# Patient Record
Sex: Female | Born: 1985
Health system: Southern US, Community
[De-identification: ages and names within clinical notes are randomized; demographics above are authoritative.]

## PROBLEM LIST (undated history)

## (undated) DIAGNOSIS — R569 Unspecified convulsions: Secondary | ICD-10-CM

## (undated) DIAGNOSIS — B182 Chronic viral hepatitis C: Secondary | ICD-10-CM

## (undated) DIAGNOSIS — E119 Type 2 diabetes mellitus without complications: Secondary | ICD-10-CM

## (undated) HISTORY — DX: Chronic viral hepatitis C: B18.2

---

## 2004-02-13 ENCOUNTER — Emergency Department (HOSPITAL_COMMUNITY): Admission: EM | Admit: 2004-02-13 | Discharge: 2004-02-13 | Payer: Self-pay | Admitting: Emergency Medicine

## 2014-10-10 ENCOUNTER — Emergency Department (HOSPITAL_COMMUNITY)
Admission: EM | Admit: 2014-10-10 | Discharge: 2014-10-12 | Disposition: A | Discharge status: Home / Self Care | Payer: Medicaid Other | Attending: Emergency Medicine | Admitting: Emergency Medicine

## 2014-10-10 ENCOUNTER — Encounter (HOSPITAL_COMMUNITY): Payer: Self-pay | Admitting: Emergency Medicine

## 2014-10-10 DIAGNOSIS — Z79899 Other long term (current) drug therapy: Secondary | ICD-10-CM | POA: Diagnosis not present

## 2014-10-10 DIAGNOSIS — F1120 Opioid dependence, uncomplicated: Secondary | ICD-10-CM | POA: Diagnosis not present

## 2014-10-10 DIAGNOSIS — F1110 Opioid abuse, uncomplicated: Secondary | ICD-10-CM | POA: Insufficient documentation

## 2014-10-10 DIAGNOSIS — Z3A33 33 weeks gestation of pregnancy: Secondary | ICD-10-CM | POA: Diagnosis not present

## 2014-10-10 DIAGNOSIS — G40909 Epilepsy, unspecified, not intractable, without status epilepticus: Secondary | ICD-10-CM | POA: Diagnosis not present

## 2014-10-10 DIAGNOSIS — Z91040 Latex allergy status: Secondary | ICD-10-CM | POA: Insufficient documentation

## 2014-10-10 DIAGNOSIS — O99343 Other mental disorders complicating pregnancy, third trimester: Secondary | ICD-10-CM | POA: Diagnosis not present

## 2014-10-10 DIAGNOSIS — O99323 Drug use complicating pregnancy, third trimester: Secondary | ICD-10-CM | POA: Insufficient documentation

## 2014-10-10 DIAGNOSIS — O99353 Diseases of the nervous system complicating pregnancy, third trimester: Secondary | ICD-10-CM | POA: Diagnosis not present

## 2014-10-10 DIAGNOSIS — Z794 Long term (current) use of insulin: Secondary | ICD-10-CM | POA: Insufficient documentation

## 2014-10-10 DIAGNOSIS — O24113 Pre-existing diabetes mellitus, type 2, in pregnancy, third trimester: Secondary | ICD-10-CM | POA: Insufficient documentation

## 2014-10-10 DIAGNOSIS — F329 Major depressive disorder, single episode, unspecified: Secondary | ICD-10-CM | POA: Insufficient documentation

## 2014-10-10 HISTORY — DX: Unspecified convulsions: R56.9

## 2014-10-10 HISTORY — DX: Type 2 diabetes mellitus without complications: E11.9

## 2014-10-10 LAB — URINALYSIS, ROUTINE W REFLEX MICROSCOPIC
Glucose, UA: NEGATIVE mg/dL
Hgb urine dipstick: NEGATIVE
Ketones, ur: NEGATIVE mg/dL
NITRITE: NEGATIVE
PH: 6 (ref 5.0–8.0)
Protein, ur: NEGATIVE mg/dL
SPECIFIC GRAVITY, URINE: 1.034 — AB (ref 1.005–1.030)
Urobilinogen, UA: 1 mg/dL (ref 0.0–1.0)

## 2014-10-10 LAB — CBC WITH DIFFERENTIAL/PLATELET
BASOS ABS: 0 10*3/uL (ref 0.0–0.1)
Basophils Relative: 0 % (ref 0–1)
EOS PCT: 1 % (ref 0–5)
Eosinophils Absolute: 0 10*3/uL (ref 0.0–0.7)
HCT: 28.1 % — ABNORMAL LOW (ref 36.0–46.0)
Hemoglobin: 9.2 g/dL — ABNORMAL LOW (ref 12.0–15.0)
LYMPHS PCT: 26 % (ref 12–46)
Lymphs Abs: 1.6 10*3/uL (ref 0.7–4.0)
MCH: 29 pg (ref 26.0–34.0)
MCHC: 32.7 g/dL (ref 30.0–36.0)
MCV: 88.6 fL (ref 78.0–100.0)
Monocytes Absolute: 0.5 10*3/uL (ref 0.1–1.0)
Monocytes Relative: 8 % (ref 3–12)
NEUTROS ABS: 4 10*3/uL (ref 1.7–7.7)
Neutrophils Relative %: 65 % (ref 43–77)
PLATELETS: 280 10*3/uL (ref 150–400)
RBC: 3.17 MIL/uL — ABNORMAL LOW (ref 3.87–5.11)
RDW: 14.8 % (ref 11.5–15.5)
WBC: 6.1 10*3/uL (ref 4.0–10.5)

## 2014-10-10 LAB — SALICYLATE LEVEL

## 2014-10-10 LAB — COMPREHENSIVE METABOLIC PANEL
ALBUMIN: 2.5 g/dL — AB (ref 3.5–5.2)
ALK PHOS: 85 U/L (ref 39–117)
ALT: 20 U/L (ref 0–35)
AST: 23 U/L (ref 0–37)
Anion gap: 5 (ref 5–15)
BILIRUBIN TOTAL: 0.2 mg/dL — AB (ref 0.3–1.2)
BUN: 7 mg/dL (ref 6–23)
CHLORIDE: 109 mmol/L (ref 96–112)
CO2: 19 mmol/L (ref 19–32)
Calcium: 7.7 mg/dL — ABNORMAL LOW (ref 8.4–10.5)
Creatinine, Ser: 0.5 mg/dL (ref 0.50–1.10)
GFR calc Af Amer: >90 mL/min (ref >90)
GFR calc non Af Amer: >90 mL/min (ref >90)
Glucose, Bld: 109 mg/dL — ABNORMAL HIGH (ref 70–99)
POTASSIUM: 2.9 mmol/L — AB (ref 3.5–5.1)
Sodium: 133 mmol/L — ABNORMAL LOW (ref 135–145)
TOTAL PROTEIN: 6.3 g/dL (ref 6.0–8.3)

## 2014-10-10 LAB — RAPID URINE DRUG SCREEN, HOSP PERFORMED
AMPHETAMINES: NOT DETECTED
BARBITURATES: NOT DETECTED
BENZODIAZEPINES: NOT DETECTED
COCAINE: NOT DETECTED
Opiates: POSITIVE — AB
Tetrahydrocannabinol: NOT DETECTED

## 2014-10-10 LAB — URINE MICROSCOPIC-ADD ON

## 2014-10-10 LAB — ETHANOL

## 2014-10-10 LAB — ACETAMINOPHEN LEVEL

## 2014-10-10 MED ORDER — INSULIN ASPART 100 UNIT/ML ~~LOC~~ SOLN
0.0000 [IU] | SUBCUTANEOUS | Status: DC
Start: 2014-10-11 — End: 2014-10-11

## 2014-10-10 MED ORDER — ACETAMINOPHEN 325 MG PO TABS: 650.0000 mg | ORAL_TABLET | Freq: Four times a day (QID) | ORAL | Status: DC | PRN

## 2014-10-10 MED ORDER — POTASSIUM CHLORIDE CRYS ER 20 MEQ PO TBCR
40.0000 meq | EXTENDED_RELEASE_TABLET | Freq: Once | ORAL | Status: AC
  Administered 2014-10-11: 40 meq via ORAL
  Filled 2014-10-10: qty 2

## 2014-10-10 MED ORDER — ONDANSETRON 4 MG PO TBDP
4.0000 mg | ORAL_TABLET | Freq: Once | ORAL | Status: AC
  Administered 2014-10-10: 4 mg via ORAL
  Filled 2014-10-10: qty 1

## 2014-10-10 NOTE — ED Notes (Signed)
Rapid response nurse at bedside.

## 2014-10-10 NOTE — ED Notes (Signed)
Pt reports she is 7 months pregnant and is going into withdrawals from Roxicodone. Pt states she has hx of heroin addiction and reports she is depressed.
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
## 2014-10-10 NOTE — ED Notes (Signed)
TTS at bedside. 

## 2014-10-10 NOTE — Progress Notes (Signed)
Pt [facility] [place] (last seen earlier today for a routine office visit). She states she is a G9P0,  a Type 1 diabetic. She has a roxicodone (IV user) addiction, last used today. She states she is ready to quit and looking for help, and is depressed. She has a reactive NST (135bpm, multiple accelerations and no decelerations) and some uterine irritability.

## 2014-10-10 NOTE — Progress Notes (Signed)
Notified Dr. Jolayne Pantheronstant of patient, obstetrical history, reactive NST and no contractions. OK to d/c fetal monitoring.

## 2014-10-10 NOTE — ED Provider Notes (Signed)
CSN: [id number]     Arrival date & time 10/10/14  2019 History   First MD Initiated Contact with Patient 10/10/14 2120     Chief Complaint  Patient presents with  . Withdrawals   . Depression     (Consider location/radiation/quality/duration/timing/severity/associated sxs/prior Treatment) HPI [age] year old female with a history of diabetes and opiate drug addiction presents requesting help to detox from narcotics and heroin. The patient is currently about [redacted] weeks pregnant. 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 She [place] [facility] [place]. She's been using drugs for quite some time, recently relapsed. Shot up oxycodone today, previously used about one week ago. Over the last 3 days she's been feeling abdominal cramping, diarrhea, and aching. She attributes this to prior similar withdrawal symptoms. The patient states she has not felt any contractions although she's not know exactly what this feels like. No large gush of fluid. No dysuria, her urine has been darker though. When she gave us a urine sample she said she wiped and saw small amount of blood. Has not noticed any vaginal bleeding. No suicidal or homicidal thoughts. Does state she is depressed.  Past Medical History  Diagnosis Date  . Diabetes mellitus without complication   . Seizures    History reviewed. No pertinent past surgical history. History reviewed. No pertinent family history. History  Substance Use Topics  . Smoking status: Never Smoker   . Smokeless tobacco: Not on file  . Alcohol Use: No   OB History    Gravida Para Term Preterm AB TAB SAB Ectopic Multiple Living   1              Review of Systems  Constitutional: Positive for chills. Negative for fever.  Gastrointestinal: Positive for abdominal pain and diarrhea. Negative for nausea and vomiting.  Genitourinary: Negative for dysuria.  Musculoskeletal: Negative for back pain.  All other systems  reviewed and are negative.     Allergies  Fish allergy and Latex  Home Medications   Prior to Admission medications   Medication Sig Start Date End Date Taking? Authorizing Provider  acetaminophen (TYLENOL) 500 MG tablet Take 1,000 mg by mouth every 6 (six) hours as needed for moderate pain (pian).   Yes Historical Provider, MD  Insulin Glargine (LANTUS) 100 UNIT/ML Solostar Pen Inject into the skin 3 (three) times daily as needed (blood sugar).    Yes Historical Provider, MD  IRON PO Take 1 tablet by mouth daily.   Yes Historical Provider, MD  levETIRAcetam (KEPPRA) 250 MG tablet Take 750 mg by mouth 2 (two) times daily.   Yes Historical Provider, MD  oxycodone (ROXICODONE) 30 MG immediate release tablet Take 60 mg by mouth every 6 (six) hours as needed for pain (addiction).   Yes Historical Provider, MD  Prenatal Vit-Fe Fumarate-FA (PRENATAL MULTIVITAMIN) TABS tablet Take 1 tablet by mouth 2 (two) times daily.   Yes Historical Provider, MD  topiramate (TOPAMAX) 25 MG tablet Take 50-75 mg by mouth 3 (three) times daily. Take 3 tablets in the am, Take 2 tablets in midday and Take 3 tablets in the evening.   Yes Historical Provider, MD   BP 135/79 mmHg  Pulse 77  Temp(Src) 98 F (36.7 C) (Oral)  Resp 15  SpO2 98% Physical Exam  Constitutional: She is oriented to person, place, and time. She appears well-developed and well-nourished.  HENT:  Head: Normocephalic and atraumatic.  Right Ear: External  ear normal.  Left Ear: External ear normal.  Nose: Nose normal.  Eyes: Right eye exhibits no discharge. Left eye exhibits no discharge.  Cardiovascular: Normal rate, regular rhythm and normal heart sounds.   Pulmonary/Chest: Effort normal and breath sounds normal.  Abdominal: Soft. There is no tenderness.  Neurological: She is alert and oriented to person, place, and time.  Skin: Skin is warm and dry.  Vitals reviewed.   ED Course  Procedures (including critical care time) Labs  Review Labs Reviewed  COMPREHENSIVE METABOLIC PANEL - Abnormal; Notable for the following:    Sodium 133 (*)    Potassium 2.9 (*)    Glucose, Bld 109 (*)    Calcium 7.7 (*)    Albumin 2.5 (*)    Total Bilirubin 0.2 (*)    All other components within normal limits  URINALYSIS, ROUTINE W REFLEX MICROSCOPIC - Abnormal; Notable for the following:    Color, Urine AMBER (*)    APPearance CLOUDY (*)    Specific Gravity, Urine 1.034 (*)    Bilirubin Urine SMALL (*)    Leukocytes, UA TRACE (*)    All other components within normal limits  URINE RAPID DRUG SCREEN (HOSP PERFORMED) - Abnormal; Notable for the following:    Opiates POSITIVE (*)    All other components within normal limits  CBC WITH DIFFERENTIAL/PLATELET - Abnormal; Notable for the following:    RBC 3.17 (*)    Hemoglobin 9.2 (*)    HCT 28.1 (*)    All other components within normal limits  ACETAMINOPHEN LEVEL - Abnormal; Notable for the following:    Acetaminophen (Tylenol), Serum <10.0 (*)    All other components within normal limits  URINE CULTURE  SALICYLATE LEVEL  ETHANOL  URINE MICROSCOPIC-ADD ON  BASIC METABOLIC PANEL  CBG MONITORING, ED    Imaging Review No results found.   EKG Interpretation None      MDM   Final diagnoses:  Opiate abuse, episodic   Patient evaluated by rapid response nurse. No further blood noted, patient wore pad with no blood present. OB at this time does not recommend manual exam. No contractions on monitor, mild irritability but baby appears well. Psych consulted given she is a complex patient requesting detox, they will begin to search for placement and hold overnight.      Pricilla Loveless, MD 10/11/14 [phone]

## 2014-10-11 DIAGNOSIS — F1110 Opioid abuse, uncomplicated: Secondary | ICD-10-CM | POA: Insufficient documentation

## 2014-10-11 DIAGNOSIS — F1120 Opioid dependence, uncomplicated: Secondary | ICD-10-CM | POA: Diagnosis not present

## 2014-10-11 LAB — BASIC METABOLIC PANEL
ANION GAP: 5 (ref 5–15)
BUN: 7 mg/dL (ref 6–23)
CHLORIDE: 111 mmol/L (ref 96–112)
CO2: 19 mmol/L (ref 19–32)
Calcium: 7.8 mg/dL — ABNORMAL LOW (ref 8.4–10.5)
Creatinine, Ser: 0.53 mg/dL (ref 0.50–1.10)
GFR calc non Af Amer: >90 mL/min (ref >90)
Glucose, Bld: 82 mg/dL (ref 70–99)
POTASSIUM: 3.5 mmol/L (ref 3.5–5.1)
SODIUM: 135 mmol/L (ref 135–145)

## 2014-10-11 LAB — CBG MONITORING, ED
GLUCOSE-CAPILLARY: 80 mg/dL (ref 70–99)
GLUCOSE-CAPILLARY: 91 mg/dL (ref 70–99)
Glucose-Capillary: 78 mg/dL (ref 70–99)
Glucose-Capillary: 89 mg/dL (ref 70–99)
Glucose-Capillary: 86 mg/dL (ref 70–99)

## 2014-10-11 MED ORDER — INSULIN ASPART 100 UNIT/ML ~~LOC~~ SOLN: 0.0000 [IU] | Freq: Three times a day (TID) | SUBCUTANEOUS | Status: DC

## 2014-10-11 MED ORDER — ONDANSETRON HCL 4 MG PO TABS
4.0000 mg | ORAL_TABLET | Freq: Three times a day (TID) | ORAL | Status: DC | PRN
  Administered 2014-10-11 – 2014-10-12 (×2): 4 mg via ORAL
  Filled 2014-10-11 (×2): qty 1

## 2014-10-11 MED ORDER — HYDROXYZINE HCL 25 MG PO TABS
25.0000 mg | ORAL_TABLET | Freq: Three times a day (TID) | ORAL | Status: DC | PRN
  Administered 2014-10-11: 25 mg via ORAL
  Filled 2014-10-11: qty 1

## 2014-10-11 MED ORDER — PROMETHAZINE HCL 25 MG PO TABS
25.0000 mg | ORAL_TABLET | Freq: Once | ORAL | Status: AC
  Administered 2014-10-11: 25 mg via ORAL
  Filled 2014-10-11: qty 1

## 2014-10-11 MED ORDER — FOLIC ACID 1 MG PO TABS
1.0000 mg | ORAL_TABLET | Freq: Every day | ORAL | Status: DC
  Administered 2014-10-12: 1 mg via ORAL
  Filled 2014-10-11: qty 1

## 2014-10-11 MED ORDER — ADULT MULTIVITAMIN W/MINERALS CH
1.0000 | ORAL_TABLET | Freq: Every day | ORAL | Status: DC
  Administered 2014-10-12: 1 via ORAL
  Filled 2014-10-11: qty 1

## 2014-10-11 MED ORDER — CLONIDINE HCL 0.1 MG PO TABS
0.1000 mg | ORAL_TABLET | Freq: Three times a day (TID) | ORAL | Status: DC
  Administered 2014-10-11 – 2014-10-12 (×3): 0.1 mg via ORAL
  Filled 2014-10-11 (×3): qty 1

## 2014-10-11 MED ORDER — LEVETIRACETAM 750 MG PO TABS
750.0000 mg | ORAL_TABLET | Freq: Two times a day (BID) | ORAL | Status: DC
  Administered 2014-10-11 – 2014-10-12 (×3): 750 mg via ORAL
  Filled 2014-10-11 (×5): qty 1

## 2014-10-11 NOTE — ED Provider Notes (Signed)
I have evaluated the patient's chart. I have looked up her medications. I spoke with our OB/GYN on-call for recommendations regarding medication treatment for her #1 narcotic withdrawal, and #2 her seizure disorder.  Topamax is class D and should be avoided. However, it is most teratogenic during neural tube formation in first trimester. I'm hesitant to order this being class D in a pregnant female.  Keppra is class C and will be continued for her seizure prophylaxis.  Regarding her narcotic withdrawal: This is not a risk for her for seizures or for medical instability. Regarding her symptoms, OB/GYN felt that hydroxyzine, and clonidine were both safe and somewhat effective in pregnant females in third trimester for treatment of alcohol trauma.  Orders written for Keppra, hydroxyzine, and clonidine.   Rolland PorterMark Jettie Lazare, MD 10/11/14 1350

## 2014-10-11 NOTE — Consult Note (Signed)
[facility] Consult   Reason for Consult:  Pregnant , Opioid use disorder, severe Referring Physician:  EDP Patient Identification: Elizabeth Clarke MRN:  [id number] Principal Diagnosis: Opioid use disorder, severe, dependence Diagnosis:   Patient Active Problem List   Diagnosis Date Noted  . Opioid use disorder, severe, dependence [F11.20] 10/11/2014    Total Time spent with patient: 1 hour  Subjective:   Elizabeth Clarke is a 29 y.o. female patient admitted with Opioid use disorder, pregnant.  HPI:  Caucasian female, 8 months pregnant came in seeking detox treatment from [facility].  Patient is also 8 months pregnant.  Patient reported using Heroin and Roxicodone.  She reported that she has been using these two drugs since 3 and half months into her pregnancy.   She also stated that she has had addiction issues long before her pregnancy.  She uses 2-3 GM of Heroin a day and she uses daily, last use was yesterday.  She uses 2 Roxicodone daily.  She reports Withdrawal symptoms of Diarrhea, vomiting and shakes.   Patient denies any other hx of mental illness and does not see a Psychiatrist.  We are looking for placement at a facility capable of managing pregnant women with addiction.  Patient is being referred to [facility] and we will contact [facility] for treatment.  We are treating her nausea and vomiting with Zofran.  HPI Elements:   Location:  Opioid Dependence, severe. Quality:  severe. Severity:  severe. Timing:  Acute. Duration:  a Year. Context:  Seeking Detox treatment..  Past Medical History:  Past Medical History  Diagnosis Date  . Diabetes mellitus without complication   . Seizures    History reviewed. No pertinent past surgical history. Family History: History reviewed. No pertinent family history. Social History:  History  Alcohol Use No     History  Drug Use  . Yes  . Special: Heroin, Other-see comments    Comment: injects "roxys"    History    Social History  . Marital Status: Single    Spouse Name: N/A  . Number of Children: N/A  . Years of Education: N/A   Social History Main Topics  . Smoking status: Never Smoker   . Smokeless tobacco: Not on file  . Alcohol Use: No  . Drug Use: Yes    Special: Heroin, Other-see comments     Comment: injects "roxys"  . Sexual Activity: Yes   Other Topics Concern  . None   Social History Narrative  . None   Additional Social History:    History of alcohol / drug use?: Yes Negative Consequences of Use: Personal relationships, Legal Name of Substance 1: Opioids  1 - Age of First Use: 26 1 - Amount (size/oz): "3-77m" 1 - Frequency: daily  1 - Duration: 1 mo 1 - Last Use / Amount: 10-10-14 "I shot some up today".                    Allergies:   Allergies  Allergen Reactions  . Fish Allergy Anaphylaxis  . Latex Rash    Labs:  Results for orders placed or performed during the hospital encounter of 10/10/14 (from the past 48 hour(s))  Urinalysis, Routine w reflex microscopic     Status: Abnormal   Collection Time: 10/10/14  9:58 PM  Result Value Ref Range   Color, Urine AMBER (A) YELLOW    Comment: BIOCHEMICALS MAY BE AFFECTED BY COLOR   APPearance CLOUDY (A)  CLEAR   Specific Gravity, Urine 1.034 (H) 1.005 - 1.030   pH 6.0 5.0 - 8.0   Glucose, UA NEGATIVE NEGATIVE mg/dL   Hgb urine dipstick NEGATIVE NEGATIVE   Bilirubin Urine SMALL (A) NEGATIVE   Ketones, ur NEGATIVE NEGATIVE mg/dL   Protein, ur NEGATIVE NEGATIVE mg/dL   Urobilinogen, UA 1.0 0.0 - 1.0 mg/dL   Nitrite NEGATIVE NEGATIVE   Leukocytes, UA TRACE (A) NEGATIVE  Urine rapid drug screen (hosp performed)     Status: Abnormal   Collection Time: 10/10/14  9:58 PM  Result Value Ref Range   Opiates POSITIVE (A) NONE DETECTED   Cocaine NONE DETECTED NONE DETECTED   Benzodiazepines NONE DETECTED NONE DETECTED   Amphetamines NONE DETECTED NONE DETECTED   Tetrahydrocannabinol NONE DETECTED NONE DETECTED    Barbiturates NONE DETECTED NONE DETECTED    Comment:        DRUG SCREEN FOR MEDICAL PURPOSES ONLY.  IF CONFIRMATION IS NEEDED FOR ANY PURPOSE, NOTIFY LAB WITHIN 5 DAYS.        LOWEST DETECTABLE LIMITS FOR URINE DRUG SCREEN Drug Class       Cutoff (ng/mL) Amphetamine      1000 Barbiturate      200 Benzodiazepine   433 Tricyclics       295 Opiates          300 Cocaine          300 THC              50   Urine microscopic-add on     Status: None   Collection Time: 10/10/14  9:58 PM  Result Value Ref Range   Squamous Epithelial / LPF RARE RARE   WBC, UA 0-2 <3 WBC/hpf  Comprehensive metabolic panel     Status: Abnormal   Collection Time: 10/10/14 10:16 PM  Result Value Ref Range   Sodium 133 (L) 135 - 145 mmol/L   Potassium 2.9 (L) 3.5 - 5.1 mmol/L   Chloride 109 96 - 112 mmol/L   CO2 19 19 - 32 mmol/L   Glucose, Bld 109 (H) 70 - 99 mg/dL   BUN 7 6 - 23 mg/dL   Creatinine, Ser 0.50 0.50 - 1.10 mg/dL   Calcium 7.7 (L) 8.4 - 10.5 mg/dL   Total Protein 6.3 6.0 - 8.3 g/dL   Albumin 2.5 (L) 3.5 - 5.2 g/dL   AST 23 0 - 37 U/L   ALT 20 0 - 35 U/L   Alkaline Phosphatase 85 39 - 117 U/L   Total Bilirubin 0.2 (L) 0.3 - 1.2 mg/dL   GFR calc non Af Amer >90 >90 mL/min   GFR calc Af Amer >90 >90 mL/min    Comment: (NOTE) The eGFR has been calculated using the CKD EPI equation. This calculation has not been validated in all clinical situations. eGFR's persistently <90 mL/min signify possible Chronic Kidney Disease.    Anion gap 5 5 - 15  CBC with Differential     Status: Abnormal   Collection Time: 10/10/14 10:16 PM  Result Value Ref Range   WBC 6.1 4.0 - 10.5 K/uL   RBC 3.17 (L) 3.87 - 5.11 MIL/uL   Hemoglobin 9.2 (L) 12.0 - 15.0 g/dL   HCT 28.1 (L) 36.0 - 46.0 %   MCV 88.6 78.0 - 100.0 fL   MCH 29.0 26.0 - 34.0 pg   MCHC 32.7 30.0 - 36.0 g/dL   RDW 14.8 11.5 - 15.5 %   Platelets 280 150 -  400 K/uL   Neutrophils Relative % 65 43 - 77 %   Neutro Abs 4.0 1.7 - 7.7 K/uL    Lymphocytes Relative 26 12 - 46 %   Lymphs Abs 1.6 0.7 - 4.0 K/uL   Monocytes Relative 8 3 - 12 %   Monocytes Absolute 0.5 0.1 - 1.0 K/uL   Eosinophils Relative 1 0 - 5 %   Eosinophils Absolute 0.0 0.0 - 0.7 K/uL   Basophils Relative 0 0 - 1 %   Basophils Absolute 0.0 0.0 - 0.1 K/uL  Salicylate level     Status: None   Collection Time: 10/10/14 10:16 PM  Result Value Ref Range   Salicylate Lvl <8.5 2.8 - 20.0 mg/dL  Acetaminophen level     Status: Abnormal   Collection Time: 10/10/14 10:16 PM  Result Value Ref Range   Acetaminophen (Tylenol), Serum <10.0 (L) 10 - 30 ug/mL    Comment:        THERAPEUTIC CONCENTRATIONS VARY SIGNIFICANTLY. A RANGE OF 10-30 ug/mL MAY BE AN EFFECTIVE CONCENTRATION FOR MANY PATIENTS. HOWEVER, SOME ARE BEST TREATED AT CONCENTRATIONS OUTSIDE THIS RANGE. ACETAMINOPHEN CONCENTRATIONS >150 ug/mL AT 4 HOURS AFTER INGESTION AND >50 ug/mL AT 12 HOURS AFTER INGESTION ARE OFTEN ASSOCIATED WITH TOXIC REACTIONS.   Ethanol     Status: None   Collection Time: 10/10/14 10:16 PM  Result Value Ref Range   Alcohol, Ethyl (B) <5 0 - 9 mg/dL    Comment:        LOWEST DETECTABLE LIMIT FOR SERUM ALCOHOL IS 11 mg/dL FOR MEDICAL PURPOSES ONLY   CBG monitoring, ED     Status: None   Collection Time: 10/11/14 12:29 AM  Result Value Ref Range   Glucose-Capillary 91 70 - 99 mg/dL  CBG monitoring, ED     Status: None   Collection Time: 10/11/14  4:06 AM  Result Value Ref Range   Glucose-Capillary 78 70 - 99 mg/dL  Basic metabolic panel     Status: Abnormal   Collection Time: 10/11/14  5:58 AM  Result Value Ref Range   Sodium 135 135 - 145 mmol/L   Potassium 3.5 3.5 - 5.1 mmol/L    Comment: DELTA CHECK NOTED REPEATED TO VERIFY NO VISIBLE HEMOLYSIS    Chloride 111 96 - 112 mmol/L   CO2 19 19 - 32 mmol/L   Glucose, Bld 82 70 - 99 mg/dL   BUN 7 6 - 23 mg/dL   Creatinine, Ser 0.53 0.50 - 1.10 mg/dL   Calcium 7.8 (L) 8.4 - 10.5 mg/dL   GFR calc non Af Amer >90  >90 mL/min   GFR calc Af Amer >90 >90 mL/min    Comment: (NOTE) The eGFR has been calculated using the CKD EPI equation. This calculation has not been validated in all clinical situations. eGFR's persistently <90 mL/min signify possible Chronic Kidney Disease.    Anion gap 5 5 - 15  CBG monitoring, ED     Status: None   Collection Time: 10/11/14 12:00 PM  Result Value Ref Range   Glucose-Capillary 89 70 - 99 mg/dL    Vitals: Blood pressure 110/70, pulse 70, temperature 98.1 F (36.7 C), temperature source Oral, resp. rate 18, last menstrual period 02/23/2014, SpO2 98 %.  Risk to Self: Suicidal Ideation: Yes-Currently Present Suicidal Intent: No Is patient at risk for suicide?: Yes Suicidal Plan?: No Access to Means: No What has been your use of drugs/alcohol within the last 12 months?: Daily opioid use reported.  How many times?: 4 Other Self Harm Risks: Cutting  Triggers for Past Attempts: Unpredictable Intentional Self Injurious Behavior: Cutting Comment - Self Injurious Behavior: Pt reported that she has a history of cutting. Most recent cut approximately 2 weeks ago.  Risk to Others: Homicidal Ideation: No Thoughts of Harm to Others: No Current Homicidal Intent: No Current Homicidal Plan: No Access to Homicidal Means: No Identified Victim: NA History of harm to others?: No Assessment of Violence: On admission Violent Behavior Description: No violent behaviors observed at this time.  Does patient have access to weapons?: No Criminal Charges Pending?: Yes Describe Pending Criminal Charges: Violation of probation, stealing and drug possession.  Does patient have a court date: Yes Court Date: 10/24/14 (May 3rd and 4th. ) Prior Inpatient Therapy: Prior Inpatient Therapy: Yes Prior Therapy Dates: age 53, 2015, 2016 Prior Therapy Facilty/Provider(s): [facility], [facility]  Reason for Treatment: Depression  Prior Outpatient Therapy: Prior Outpatient Therapy: Yes Prior  Therapy Dates: age 80- current  Prior Therapy Facilty/Provider(s): [facility]  Reason for Treatment: Depression   Current Facility-Administered Medications  Medication Dose Route Frequency Provider Last Rate Last Dose  . acetaminophen (TYLENOL) tablet 650 mg  650 mg Oral Q6H PRN Sherwood Gambler, MD      . cloNIDine (CATAPRES) tablet 0.1 mg  0.1 mg Oral TID Tanna Furry, MD      . hydrOXYzine (ATARAX/VISTARIL) tablet 25 mg  25 mg Oral TID PRN Tanna Furry, MD      . insulin aspart (novoLOG) injection 0-9 Units  0-9 Units Subcutaneous TID WC Tanna Furry, MD   0 Units at 10/11/14 1211  . levETIRAcetam (KEPPRA) tablet 750 mg  750 mg Oral BID Tanna Furry, MD   750 mg at 10/11/14 1145   Current Outpatient Prescriptions  Medication Sig Dispense Refill  . acetaminophen (TYLENOL) 500 MG tablet Take 1,000 mg by mouth every 6 (six) hours as needed for moderate pain (pian).    . Insulin Glargine (LANTUS) 100 UNIT/ML Solostar Pen Inject 1-4 Units into the skin 3 (three) times daily as needed (blood sugar).     . IRON PO Take 1 tablet by mouth daily.    Marland Kitchen levETIRAcetam (KEPPRA) 250 MG tablet Take 750 mg by mouth 2 (two) times daily.    Marland Kitchen oxycodone (ROXICODONE) 30 MG immediate release tablet Take 60 mg by mouth every 6 (six) hours as needed for pain (addiction).    . Prenatal Vit-Fe Fumarate-FA (PRENATAL MULTIVITAMIN) TABS tablet Take 1 tablet by mouth 2 (two) times daily.    Marland Kitchen topiramate (TOPAMAX) 25 MG tablet Take 50-75 mg by mouth 3 (three) times daily. Take 3 tablets in the am, Take 2 tablets in midday and Take 3 tablets in the evening.      Musculoskeletal: Strength & Muscle Tone: within normal limits Gait & Station: normal Patient leans: N/A  Psychiatric Specialty Exam:     Blood pressure 110/70, pulse 70, temperature 98.1 F (36.7 C), temperature source Oral, resp. rate 18, last menstrual period 02/23/2014, SpO2 98 %.There is no height or weight on file to calculate BMI.  General Appearance: Casual  and Fairly Groomed  Engineer, water::  Good  Speech:  Clear and Coherent and Normal Rate  Volume:  Normal  Mood:  Anxious  Affect:  Congruent  Thought Process:  Coherent, Goal Directed and Intact  Orientation:  Full (Time, Place, and Person)  Thought Content:  WDL  Suicidal Thoughts:  No  Homicidal Thoughts:  No  Memory:  Immediate;   Good Recent;   Good Remote;   Good  Judgement:  Poor  Insight:  Shallow  Psychomotor Activity:  Normal  Concentration:  Good  Recall:  NA  Fund of Knowledge:Fair  Language: Good  Akathisia:  NA  Handed:  Right  AIMS (if indicated):     Assets:  Desire for Improvement  ADL's:  Intact  Cognition: WNL  Sleep:      Medical Decision Making: Established Problem, Worsening (2), Review of Medication Regimen & Side Effects (2) and Review of New Medication or Change in Dosage (2)  Treatment Plan Summary: Daily contact with patient to assess and evaluate symptoms and progress in treatment, Medication management and Plan seeking placement  for detox while pregnant  Plan:  looking for placement at [facility] , [facility] Disposition: SEE ABOVE  Delfin Gant    PMHNP-BC 10/11/2014 2:22 PM Patient seen face-to-face for psychiatric evaluation, chart reviewed and case discussed with the physician extender and developed treatment plan. Reviewed the information documented and agree with the treatment plan. Corena Pilgrim, MD

## 2014-10-11 NOTE — ED Notes (Signed)
Pt ate 25% of her food 

## 2014-10-11 NOTE — ED Notes (Signed)
Patient is feeling nauseated.

## 2014-10-11 NOTE — ED Notes (Signed)
Visitor took all pt's jewelry home  One bag of personal belongings placed in locker 29

## 2014-10-11 NOTE — Progress Notes (Signed)
Pt referred to [facility], waiting for [employer] authorization.  Consulted with EDP regarding OB consult to determine if recommendations could be made for pt current needs regarding opiate withdrawal and current pregnancy while waiting for admission status from [facility]. At this time its not determined when a bed would be available.  CSW awaint return call from EDP and [facility].   Olga CoasterKristen Jeliyah Middlebrooks, LCSW  Clinical Social Work  [facility] [phone]

## 2014-10-11 NOTE — ED Notes (Signed)
Bed: WA30 Expected date:  Expected time:  Means of arrival:  Comments: 

## 2014-10-11 NOTE — ED Notes (Signed)
Fiance called-number is [phone]. States, he is afraid she is going to try to leave because she is bored. Patient is sleeping currently. Assured fiance we would speak with patient.

## 2014-10-12 LAB — URINE CULTURE
COLONY COUNT: NO GROWTH
Culture: NO GROWTH

## 2014-10-12 LAB — CBG MONITORING, ED
Glucose-Capillary: 86 mg/dL (ref 70–99)
Glucose-Capillary: 110 mg/dL — ABNORMAL HIGH (ref 70–99)
Glucose-Capillary: 82 mg/dL (ref 70–99)

## 2014-10-12 MED ORDER — ONDANSETRON HCL 4 MG/2ML IJ SOLN
INTRAMUSCULAR | Status: AC
  Filled 2014-10-12: qty 2

## 2014-10-12 MED ORDER — ONDANSETRON HCL 4 MG/2ML IJ SOLN
4.0000 mg | Freq: Once | INTRAMUSCULAR | Status: AC
  Administered 2014-10-12: 4 mg via INTRAVENOUS

## 2014-10-12 MED ORDER — PROMETHAZINE HCL 25 MG PO TABS
25.0000 mg | ORAL_TABLET | Freq: Once | ORAL | Status: AC
  Administered 2014-10-12: 25 mg via ORAL
  Filled 2014-10-12: qty 1

## 2014-10-12 MED ORDER — ONDANSETRON HCL 4 MG/2ML IJ SOLN
4.0000 mg | Freq: Once | INTRAMUSCULAR | Status: AC
  Administered 2014-10-12: 4 mg via INTRAVENOUS
  Filled 2014-10-12: qty 2

## 2014-10-12 NOTE — ED Notes (Signed)
Patient is actively vomiting. Dr. Elesa MassedWard notified.

## 2014-10-12 NOTE — Progress Notes (Signed)
[facility], due to not being able to detox pregnant women as of August 23, 2014 when the perinatal unit closed. Patient can come if already active with a methadone clinic. CSW to discuss further with patient.   Olga CoasterKristen Tava Peery, LCSW  Clinical Social Work  [facility] [phone]

## 2014-10-12 NOTE — ED Notes (Addendum)
Pt c/o feeling nauseaous and eaten 50% of breakfast. Dr. Romeo AppleHarrison aware with new order for Phenergan and re-check CBG.

## 2014-10-12 NOTE — ED Notes (Addendum)
Awake. Verbally responsive. Watching TV and talking on telephone. Resp even and unlabored. ABC's intact. No behavior problems noted. NAD noted. Sitter at bedside. Pt was offered a shower and refused, stating that she took one at 0200.

## 2014-10-12 NOTE — ED Notes (Addendum)
Elizabeth Clarke, Counselor at bedside discussing placement.

## 2014-10-12 NOTE — Consult Note (Signed)
[facility] Consult   Reason for Consult:  Pregnant , Opioid use disorder, severe Referring Physician:  EDP Patient Identification: Elizabeth Clarke MRN:  [id number] Principal Diagnosis: Opioid use disorder, severe, dependence Diagnosis:   Patient Active Problem List   Diagnosis Date Noted  . Opioid use disorder, severe, dependence [F11.20] 10/11/2014  . Opiate abuse, episodic [F11.10]     Total Time spent with patient: 1 hour  Subjective:   Elizabeth Clarke is a 29 y.o. female patient admitted with Opioid use disorder, pregnant.  HPI:  Caucasian female, 8 months pregnant came in seeking detox treatment from Opiates.  Patient is also 8 months pregnant.  Patient reported using Heroin and Roxicodone.  She reported that she has been using these two drugs since 3 and half months into her pregnancy.   She also stated that she has had addiction issues long before her pregnancy.  She uses 2-3 GM of Heroin a day and she uses daily, last use was yesterday.  She uses 2 Roxicodone daily.  She reports Withdrawal symptoms of Diarrhea, vomiting and shakes.   Patient denies any other hx of mental illness and does not see a Psychiatrist.  We are looking for placement at a facility capable of managing pregnant women with addiction.  Patient is being referred to [facility] and we will contact [facility] for treatment.  We are treating her nausea and vomiting with Zofran.  10/12/14:  Patient was denied by [facility] and we are unable to secure placement for her Opiate detox because of her 8 months pregnancy.  Patient is now looking for Suboxone treatment.  Patient is discharged and being referred to [place] for Suboxone treatment.    Patient is discharged home now.  HPI Elements:   Location:  Opioid Dependence, severe. Quality:  severe. Severity:  severe. Timing:  Acute. Duration:  a Year. Context:  Seeking Detox treatment..  Past Medical History:  Past Medical History  Diagnosis Date  .  Diabetes mellitus without complication   . Seizures    History reviewed. No pertinent past surgical history. Family History: History reviewed. No pertinent family history. Social History:  History  Alcohol Use No     History  Drug Use  . Yes  . Special: Heroin, Other-see comments    Comment: injects "roxys"    History   Social History  . Marital Status: Single    Spouse Name: N/A  . Number of Children: N/A  . Years of Education: N/A   Social History Main Topics  . Smoking status: Never Smoker   . Smokeless tobacco: Not on file  . Alcohol Use: No  . Drug Use: Yes    Special: Heroin, Other-see comments     Comment: injects "roxys"  . Sexual Activity: Yes   Other Topics Concern  . None   Social History Narrative  . None   Additional Social History:    History of alcohol / drug use?: Yes Negative Consequences of Use: Personal relationships, Legal Name of Substance 1: Opioids  1 - Age of First Use: 26 1 - Amount (size/oz): "3-30mg " 1 - Frequency: daily  1 - Duration: 1 mo 1 - Last Use / Amount: 10-10-14 "I shot some up today".                    Allergies:   Allergies  Allergen Reactions  . Fish Allergy Anaphylaxis  . Latex Rash    Labs:  Results for orders  placed or performed during the hospital encounter of 10/10/14 (from the past 48 hour(s))  Urinalysis, Routine w reflex microscopic     Status: Abnormal   Collection Time: 10/10/14  9:58 PM  Result Value Ref Range   Color, Urine AMBER (A) YELLOW    Comment: BIOCHEMICALS MAY BE AFFECTED BY COLOR   APPearance CLOUDY (A) CLEAR   Specific Gravity, Urine 1.034 (H) 1.005 - 1.030   pH 6.0 5.0 - 8.0   Glucose, UA NEGATIVE NEGATIVE mg/dL   Hgb urine dipstick NEGATIVE NEGATIVE   Bilirubin Urine SMALL (A) NEGATIVE   Ketones, ur NEGATIVE NEGATIVE mg/dL   Protein, ur NEGATIVE NEGATIVE mg/dL   Urobilinogen, UA 1.0 0.0 - 1.0 mg/dL   Nitrite NEGATIVE NEGATIVE   Leukocytes, UA TRACE (A) NEGATIVE  Urine  culture     Status: None   Collection Time: 10/10/14  9:58 PM  Result Value Ref Range   Specimen Description URINE, CLEAN CATCH    Special Requests NONE    Colony Count NO GROWTH Performed at [facility]     Culture NO GROWTH Performed at [facility]     Report Status 10/12/2014 FINAL   Urine rapid drug screen (hosp performed)     Status: Abnormal   Collection Time: 10/10/14  9:58 PM  Result Value Ref Range   Opiates POSITIVE (A) NONE DETECTED   Cocaine NONE DETECTED NONE DETECTED   Benzodiazepines NONE DETECTED NONE DETECTED   Amphetamines NONE DETECTED NONE DETECTED   Tetrahydrocannabinol NONE DETECTED NONE DETECTED   Barbiturates NONE DETECTED NONE DETECTED    Comment:        DRUG SCREEN FOR MEDICAL PURPOSES ONLY.  IF CONFIRMATION IS NEEDED FOR ANY PURPOSE, NOTIFY LAB WITHIN 5 DAYS.        LOWEST DETECTABLE LIMITS FOR URINE DRUG SCREEN Drug Class       Cutoff (ng/mL) Amphetamine      1000 Barbiturate      200 Benzodiazepine   474 Tricyclics       259 Opiates          300 Cocaine          300 THC              50   Urine microscopic-add on     Status: None   Collection Time: 10/10/14  9:58 PM  Result Value Ref Range   Squamous Epithelial / LPF RARE RARE   WBC, UA 0-2 <3 WBC/hpf  Comprehensive metabolic panel     Status: Abnormal   Collection Time: 10/10/14 10:16 PM  Result Value Ref Range   Sodium 133 (L) 135 - 145 mmol/L   Potassium 2.9 (L) 3.5 - 5.1 mmol/L   Chloride 109 96 - 112 mmol/L   CO2 19 19 - 32 mmol/L   Glucose, Bld 109 (H) 70 - 99 mg/dL   BUN 7 6 - 23 mg/dL   Creatinine, Ser 0.50 0.50 - 1.10 mg/dL   Calcium 7.7 (L) 8.4 - 10.5 mg/dL   Total Protein 6.3 6.0 - 8.3 g/dL   Albumin 2.5 (L) 3.5 - 5.2 g/dL   AST 23 0 - 37 U/L   ALT 20 0 - 35 U/L   Alkaline Phosphatase 85 39 - 117 U/L   Total Bilirubin 0.2 (L) 0.3 - 1.2 mg/dL   GFR calc non Af Amer >90 >90 mL/min   GFR calc Af Amer >90 >90 mL/min    Comment: (NOTE) The eGFR has  been  calculated using the CKD EPI equation. This calculation has not been validated in all clinical situations. eGFR's persistently <90 mL/min signify possible Chronic Kidney Disease.    Anion gap 5 5 - 15  CBC with Differential     Status: Abnormal   Collection Time: 10/10/14 10:16 PM  Result Value Ref Range   WBC 6.1 4.0 - 10.5 K/uL   RBC 3.17 (L) 3.87 - 5.11 MIL/uL   Hemoglobin 9.2 (L) 12.0 - 15.0 g/dL   HCT 28.1 (L) 36.0 - 46.0 %   MCV 88.6 78.0 - 100.0 fL   MCH 29.0 26.0 - 34.0 pg   MCHC 32.7 30.0 - 36.0 g/dL   RDW 14.8 11.5 - 15.5 %   Platelets 280 150 - 400 K/uL   Neutrophils Relative % 65 43 - 77 %   Neutro Abs 4.0 1.7 - 7.7 K/uL   Lymphocytes Relative 26 12 - 46 %   Lymphs Abs 1.6 0.7 - 4.0 K/uL   Monocytes Relative 8 3 - 12 %   Monocytes Absolute 0.5 0.1 - 1.0 K/uL   Eosinophils Relative 1 0 - 5 %   Eosinophils Absolute 0.0 0.0 - 0.7 K/uL   Basophils Relative 0 0 - 1 %   Basophils Absolute 0.0 0.0 - 0.1 K/uL  Salicylate level     Status: None   Collection Time: 10/10/14 10:16 PM  Result Value Ref Range   Salicylate Lvl <2.0 2.8 - 20.0 mg/dL  Acetaminophen level     Status: Abnormal   Collection Time: 10/10/14 10:16 PM  Result Value Ref Range   Acetaminophen (Tylenol), Serum <10.0 (L) 10 - 30 ug/mL    Comment:        THERAPEUTIC CONCENTRATIONS VARY SIGNIFICANTLY. A RANGE OF 10-30 ug/mL MAY BE AN EFFECTIVE CONCENTRATION FOR MANY PATIENTS. HOWEVER, SOME ARE BEST TREATED AT CONCENTRATIONS OUTSIDE THIS RANGE. ACETAMINOPHEN CONCENTRATIONS >150 ug/mL AT 4 HOURS AFTER INGESTION AND >50 ug/mL AT 12 HOURS AFTER INGESTION ARE OFTEN ASSOCIATED WITH TOXIC REACTIONS.   Ethanol     Status: None   Collection Time: 10/10/14 10:16 PM  Result Value Ref Range   Alcohol, Ethyl (B) <5 0 - 9 mg/dL    Comment:        LOWEST DETECTABLE LIMIT FOR SERUM ALCOHOL IS 11 mg/dL FOR MEDICAL PURPOSES ONLY   CBG monitoring, ED     Status: None   Collection Time: 10/11/14 12:29 AM   Result Value Ref Range   Glucose-Capillary 91 70 - 99 mg/dL  CBG monitoring, ED     Status: None   Collection Time: 10/11/14  4:06 AM  Result Value Ref Range   Glucose-Capillary 78 70 - 99 mg/dL  Basic metabolic panel     Status: Abnormal   Collection Time: 10/11/14  5:58 AM  Result Value Ref Range   Sodium 135 135 - 145 mmol/L   Potassium 3.5 3.5 - 5.1 mmol/L    Comment: DELTA CHECK NOTED REPEATED TO VERIFY NO VISIBLE HEMOLYSIS    Chloride 111 96 - 112 mmol/L   CO2 19 19 - 32 mmol/L   Glucose, Bld 82 70 - 99 mg/dL   BUN 7 6 - 23 mg/dL   Creatinine, Ser 0.53 0.50 - 1.10 mg/dL   Calcium 7.8 (L) 8.4 - 10.5 mg/dL   GFR calc non Af Amer >90 >90 mL/min   GFR calc Af Amer >90 >90 mL/min    Comment: (NOTE) The eGFR has been calculated using the  CKD EPI equation. This calculation has not been validated in all clinical situations. eGFR's persistently <90 mL/min signify possible Chronic Kidney Disease.    Anion gap 5 5 - 15  CBG monitoring, ED     Status: None   Collection Time: 10/11/14 12:00 PM  Result Value Ref Range   Glucose-Capillary 89 70 - 99 mg/dL  CBG monitoring, ED     Status: None   Collection Time: 10/11/14  4:53 PM  Result Value Ref Range   Glucose-Capillary 80 70 - 99 mg/dL  CBG monitoring, ED     Status: None   Collection Time: 10/11/14  9:37 PM  Result Value Ref Range   Glucose-Capillary 86 70 - 99 mg/dL  CBG monitoring, ED     Status: None   Collection Time: 10/12/14  5:59 AM  Result Value Ref Range   Glucose-Capillary 86 70 - 99 mg/dL  CBG monitoring, ED     Status: Abnormal   Collection Time: 10/12/14  9:00 AM  Result Value Ref Range   Glucose-Capillary 110 (H) 70 - 99 mg/dL  CBG monitoring, ED     Status: None   Collection Time: 10/12/14 11:26 AM  Result Value Ref Range   Glucose-Capillary 82 70 - 99 mg/dL    Vitals: Blood pressure 136/90, pulse 69, temperature 98.1 F (36.7 C), temperature source Oral, resp. rate 16, last menstrual period  02/23/2014, SpO2 98 %.  Risk to Self: Suicidal Ideation: Yes-Currently Present Suicidal Intent: No Is patient at risk for suicide?: Yes Suicidal Plan?: No Access to Means: No What has been your use of drugs/alcohol within the last 12 months?: Daily opioid use reported.  How many times?: 4 Other Self Harm Risks: Cutting  Triggers for Past Attempts: Unpredictable Intentional Self Injurious Behavior: Cutting Comment - Self Injurious Behavior: Pt reported that she has a history of cutting. Most recent cut approximately 2 weeks ago.  Risk to Others: Homicidal Ideation: No Thoughts of Harm to Others: No Current Homicidal Intent: No Current Homicidal Plan: No Access to Homicidal Means: No Identified Victim: NA History of harm to others?: No Assessment of Violence: On admission Violent Behavior Description: No violent behaviors observed at this time.  Does patient have access to weapons?: No Criminal Charges Pending?: Yes Describe Pending Criminal Charges: Violation of probation, stealing and drug possession.  Does patient have a court date: Yes Court Date: 10/24/14 (May 3rd and 4th. ) Prior Inpatient Therapy: Prior Inpatient Therapy: Yes Prior Therapy Dates: age 3, 2015, 2016 Prior Therapy Facilty/Provider(s): [facility], [facility]  Reason for Treatment: Depression  Prior Outpatient Therapy: Prior Outpatient Therapy: Yes Prior Therapy Dates: age 23- current  Prior Therapy Facilty/Provider(s): [facility]  Reason for Treatment: Depression   Current Facility-Administered Medications  Medication Dose Route Frequency Provider Last Rate Last Dose  . acetaminophen (TYLENOL) tablet 650 mg  650 mg Oral Q6H PRN Sherwood Gambler, MD      . cloNIDine (CATAPRES) tablet 0.1 mg  0.1 mg Oral TID Tanna Furry, MD   0.1 mg at 10/12/14 0934  . folic acid (FOLVITE) tablet 1 mg  1 mg Oral Daily Nat Christen, MD   1 mg at 10/12/14 5361  . hydrOXYzine (ATARAX/VISTARIL) tablet 25 mg  25 mg Oral TID PRN Tanna Furry, MD   25 mg at 10/11/14 2118  . insulin aspart (novoLOG) injection 0-9 Units  0-9 Units Subcutaneous TID WC Tanna Furry, MD   0 Units at 10/11/14 1211  . levETIRAcetam (KEPPRA) tablet 750  mg  750 mg Oral BID Tanna Furry, MD   750 mg at 10/12/14 0933  . multivitamin with minerals tablet 1 tablet  1 tablet Oral Daily Nat Christen, MD   1 tablet at 10/12/14 0934  . ondansetron (ZOFRAN) tablet 4 mg  4 mg Oral Q8H PRN Delfin Gant, NP   4 mg at 10/12/14 4656   Current Outpatient Prescriptions  Medication Sig Dispense Refill  . acetaminophen (TYLENOL) 500 MG tablet Take 1,000 mg by mouth every 6 (six) hours as needed for moderate pain (pian).    . Insulin Glargine (LANTUS) 100 UNIT/ML Solostar Pen Inject 1-4 Units into the skin 3 (three) times daily as needed (blood sugar).     . IRON PO Take 1 tablet by mouth daily.    Marland Kitchen levETIRAcetam (KEPPRA) 250 MG tablet Take 750 mg by mouth 2 (two) times daily.    Marland Kitchen oxycodone (ROXICODONE) 30 MG immediate release tablet Take 60 mg by mouth every 6 (six) hours as needed for pain (addiction).    . Prenatal Vit-Fe Fumarate-FA (PRENATAL MULTIVITAMIN) TABS tablet Take 1 tablet by mouth 2 (two) times daily.    Marland Kitchen topiramate (TOPAMAX) 25 MG tablet Take 50-75 mg by mouth 3 (three) times daily. Take 3 tablets in the am, Take 2 tablets in midday and Take 3 tablets in the evening.      Musculoskeletal: Strength & Muscle Tone: within normal limits Gait & Station: normal Patient leans: N/A  Psychiatric Specialty Exam:     Blood pressure 136/90, pulse 69, temperature 98.1 F (36.7 C), temperature source Oral, resp. rate 16, last menstrual period 02/23/2014, SpO2 98 %.There is no height or weight on file to calculate BMI.  General Appearance: Casual and Fairly Groomed  Engineer, water::  Good  Speech:  Clear and Coherent and Normal Rate  Volume:  Normal  Mood:  Anxious  Affect:  Congruent  Thought Process:  Coherent, Goal Directed and Intact  Orientation:  Full  (Time, Place, and Person)  Thought Content:  WDL  Suicidal Thoughts:  No  Homicidal Thoughts:  No  Memory:  Immediate;   Good Recent;   Good Remote;   Good  Judgement:  Poor  Insight:  Shallow  Psychomotor Activity:  Normal  Concentration:  Good  Recall:  NA  Fund of Knowledge:Fair  Language: Good  Akathisia:  NA  Handed:  Right  AIMS (if indicated):     Assets:  Desire for Improvement  ADL's:  Intact  Cognition: WNL  Sleep:      Medical Decision Making: Established Problem, Stable/Improving (1)  Treatment Plan Summary: Plan Discharge home, follow up with your OBGYN, STOP using Opiates and other drugs.  Plan:  Discharge home Disposition: Discharge home to follow up with [facility] Rodney Langton    PMHNP-BC 10/12/2014 1:26 PM Patient seen face-to-face for psychiatric evaluation, chart reviewed and case discussed with the physician extender and developed treatment plan. Reviewed the information documented and agree with the treatment plan. Corena Pilgrim, MD

## 2014-10-12 NOTE — Progress Notes (Signed)
Per psychiatrist and NP, patient psychiatrically stable for discharge home. Pt denies Si/HI/Ah/VH. Patient and CSW discussed resources for suboxone. [facility] Pt plans to call [phone] for consultation. Pt aware that intakes are scheduled between 530 am and 930 am and walkins are available.   Olga CoasterKristen Jayma Volpi, LCSW  Clinical Social Work  [facility] [phone]

## 2014-10-12 NOTE — ED Notes (Signed)
Psych team at bedside .

## 2014-10-12 NOTE — ED Notes (Signed)
Rapid Response [facility].

## 2014-10-12 NOTE — ED Notes (Addendum)
Patient actively vomiting. Dr. Elesa MassedWard notified.

## 2014-10-12 NOTE — ED Notes (Signed)
Resting quietly with eye closed. Easily arousable. Verbally responsive. Resp even and unlabored. ABC's intact. No behavior problems noted. NAD noted. Sitter at bedside.  

## 2014-10-12 NOTE — [facility] Suicide Risk Assessment (Cosign Needed)
Suicide Risk Assessment  Discharge Assessment   [facility]   Demographic Factors:  Caucasian, Low socioeconomic status and Unemployed  Total Time spent with patient: 20 minutes  Musculoskeletal: Strength & Muscle Tone: within normal limits Gait & Station: normal Patient leans: N/A  Psychiatric Specialty Exam:     Blood pressure 136/90, pulse 69, temperature 98.1 F (36.7 C), temperature source Oral, resp. rate 16, last menstrual period 02/23/2014, SpO2 98 %.There is no height or weight on file to calculate BMI.  General Appearance: Casual and Fairly Groomed  Eye Contact::  Good  Speech:  Clear and Coherent and Normal Rate409  Volume:  Normal  Mood:  Anxious and Euthymic  Affect:  Congruent  Thought Process:  Coherent, Goal Directed and Intact  Orientation:  Full (Time, Place, and Person)  Thought Content:  WDL  Suicidal Thoughts:  No  Homicidal Thoughts:  No  Memory:  Immediate;   Good Recent;   Good Remote;   Good  Judgement:  Good  Insight:  Fair  Psychomotor Activity:  Normal  Concentration:  Good  Recall:  NA  Fund of Knowledge:Fair  Language: Good  Akathisia:  NA  Handed:  Right  AIMS (if indicated):     Assets:  Desire for Improvement  Sleep:     Cognition: WNL  ADL's:  Intact      Has this patient used any form of tobacco in the last 30 days? (Cigarettes, Smokeless Tobacco, Cigars, and/or Pipes) Yes, A prescription for an FDA-approved tobacco cessation medication was offered at discharge and the patient refused  Mental Status Per Nursing Assessment::   On Admission:     Current Mental Status by Physician: NA  Loss Factors: NA  Historical Factors: NA  Risk Reduction Factors:   Pregnancy, Living with another person, especially a relative and Positive therapeutic relationship  Continued Clinical Symptoms:  Severe Anxiety and/or Agitation  Cognitive Features That Contribute To Risk:  Polarized thinking    Suicide  Risk:  Minimal: No identifiable suicidal ideation.  Patients presenting with no risk factors but with morbid ruminations; may be classified as minimal risk based on the severity of the depressive symptoms  Principal Problem: Opioid use disorder, severe, dependence Discharge Diagnoses:  Patient Active Problem List   Diagnosis Date Noted  . Opioid use disorder, severe, dependence [F11.20] 10/11/2014  . Opiate abuse, episodic [F11.10]     Follow-up Information    Go to to follow up.   Why:  walk in hours 530a-930a M-F [phone]   Contact information:   [facility]  [address]  [place], [place] [address]  [phone]      Plan Of Care/Follow-up recommendations:  Activity:  as tolerated Diet:  regular  Is patient on multiple antipsychotic therapies at discharge:  No   Has Patient had three or more failed trials of antipsychotic monotherapy by history:  No  Recommended Plan for Multiple Antipsychotic Therapies: NA    Ranetta Armacost C    PMHNP-BC 10/12/2014, 1:35 PM

## 2015-08-15 ENCOUNTER — Encounter (HOSPITAL_COMMUNITY): Payer: Self-pay | Admitting: *Deleted

## 2015-10-19 DIAGNOSIS — G40909 Epilepsy, unspecified, not intractable, without status epilepticus: Secondary | ICD-10-CM | POA: Insufficient documentation

## 2015-10-19 DIAGNOSIS — E119 Type 2 diabetes mellitus without complications: Secondary | ICD-10-CM | POA: Insufficient documentation

## 2018-06-11 DIAGNOSIS — L03119 Cellulitis of unspecified part of limb: Secondary | ICD-10-CM

## 2018-07-06 ENCOUNTER — Emergency Department (HOSPITAL_COMMUNITY)
Admission: EM | Admit: 2018-07-06 | Discharge: 2018-07-06 | Disposition: A | Discharge status: Home / Self Care | Payer: Medicaid Other | Attending: Emergency Medicine | Admitting: Emergency Medicine

## 2018-07-06 ENCOUNTER — Encounter: Payer: Self-pay | Admitting: Emergency Medicine

## 2018-07-06 ENCOUNTER — Emergency Department (HOSPITAL_COMMUNITY): Payer: Medicaid Other

## 2018-07-06 ENCOUNTER — Other Ambulatory Visit (HOSPITAL_COMMUNITY): Payer: Self-pay

## 2018-07-06 ENCOUNTER — Emergency Department (HOSPITAL_BASED_OUTPATIENT_CLINIC_OR_DEPARTMENT_OTHER): Payer: Medicaid Other

## 2018-07-06 DIAGNOSIS — L0390 Cellulitis, unspecified: Secondary | ICD-10-CM

## 2018-07-06 DIAGNOSIS — R52 Pain, unspecified: Secondary | ICD-10-CM | POA: Diagnosis not present

## 2018-07-06 DIAGNOSIS — R609 Edema, unspecified: Secondary | ICD-10-CM | POA: Insufficient documentation

## 2018-07-06 DIAGNOSIS — Z79899 Other long term (current) drug therapy: Secondary | ICD-10-CM | POA: Insufficient documentation

## 2018-07-06 DIAGNOSIS — E119 Type 2 diabetes mellitus without complications: Secondary | ICD-10-CM | POA: Insufficient documentation

## 2018-07-06 DIAGNOSIS — M7989 Other specified soft tissue disorders: Secondary | ICD-10-CM

## 2018-07-06 DIAGNOSIS — R2243 Localized swelling, mass and lump, lower limb, bilateral: Secondary | ICD-10-CM | POA: Diagnosis present

## 2018-07-06 DIAGNOSIS — Z91040 Latex allergy status: Secondary | ICD-10-CM | POA: Diagnosis not present

## 2018-07-06 LAB — COMPREHENSIVE METABOLIC PANEL
ALT: 50 U/L — AB (ref 0–44)
AST: 55 U/L — ABNORMAL HIGH (ref 15–41)
Albumin: 3 g/dL — ABNORMAL LOW (ref 3.5–5.0)
Alkaline Phosphatase: 41 U/L (ref 38–126)
Anion gap: 6 (ref 5–15)
BUN: 8 mg/dL (ref 6–20)
CHLORIDE: 103 mmol/L (ref 98–111)
CO2: 23 mmol/L (ref 22–32)
CREATININE: 0.65 mg/dL (ref 0.44–1.00)
Calcium: 8.4 mg/dL — ABNORMAL LOW (ref 8.9–10.3)
GFR calc non Af Amer: >60 mL/min (ref >60)
Glucose, Bld: 96 mg/dL (ref 70–99)
Potassium: 3.3 mmol/L — ABNORMAL LOW (ref 3.5–5.1)
Sodium: 132 mmol/L — ABNORMAL LOW (ref 135–145)
Total Bilirubin: 0.5 mg/dL (ref 0.3–1.2)
Total Protein: 6.6 g/dL (ref 6.5–8.1)

## 2018-07-06 LAB — CBC WITH DIFFERENTIAL/PLATELET
ABS IMMATURE GRANULOCYTES: 0.01 10*3/uL (ref 0.00–0.07)
BASOS ABS: 0 10*3/uL (ref 0.0–0.1)
Basophils Relative: 1 %
Eosinophils Absolute: 0.1 10*3/uL (ref 0.0–0.5)
Eosinophils Relative: 4 %
HCT: 34.8 % — ABNORMAL LOW (ref 36.0–46.0)
Hemoglobin: 11.1 g/dL — ABNORMAL LOW (ref 12.0–15.0)
IMMATURE GRANULOCYTES: 0 %
Lymphocytes Relative: 47 %
Lymphs Abs: 1.7 10*3/uL (ref 0.7–4.0)
MCH: 28.1 pg (ref 26.0–34.0)
MCHC: 31.9 g/dL (ref 30.0–36.0)
MCV: 88.1 fL (ref 80.0–100.0)
MONO ABS: 0.5 10*3/uL (ref 0.1–1.0)
Monocytes Relative: 14 %
NEUTROS ABS: 1.2 10*3/uL — AB (ref 1.7–7.7)
NEUTROS PCT: 34 %
PLATELETS: 222 10*3/uL (ref 150–400)
RBC: 3.95 MIL/uL (ref 3.87–5.11)
RDW: 14.2 % (ref 11.5–15.5)
WBC: 3.7 10*3/uL — AB (ref 4.0–10.5)
nRBC: 0 % (ref 0.0–0.2)

## 2018-07-06 LAB — HCG, SERUM, QUALITATIVE: PREG SERUM: NEGATIVE

## 2018-07-06 LAB — CK: Total CK: 88 U/L (ref 38–234)

## 2018-07-06 NOTE — ED Notes (Signed)
Patient verbalizes understanding of discharge instructions. Opportunity for questioning and answers were provided. 

## 2018-07-06 NOTE — ED Provider Notes (Signed)
[facility] Provider Note   CSN: [id number] Arrival date & time: 07/06/18  1030     History   Chief Complaint Chief Complaint  Patient presents with  . Leg Swelling    HPI Elizabeth Clarke is a 33 y.o. female.  33 y.o female with a PMH of DM, Seizures, Hep C presents to the ED with a chief complaint of bilateral leg swelling x 2 months. Patient was admitted for IV antibiotics in December and reports legs had improved.However, after being discharged from the hospital with oral antibiotics (clindamycin) she states her swelling has progressively worsen. She had some leftover doxycycline which she has been taken at home. She reports worse difficulty with walking along with pain on her knee with moving around. She also endorses some shortness of breath at rest and with exertion. She denies any fevers, previous history of diabetes, trauma. Patient does have a history of Hep C and is an IVDU but denies ever using his toes or leg as a site of injection.      Past Medical History:  Diagnosis Date  . Diabetes mellitus without complication (HCC)   . Seizures [facility](HCC)     Patient Active Problem List   Diagnosis Date Noted  . Opioid use disorder, severe, dependence (HCC) 10/11/2014  . Opiate abuse, episodic (HCC)     No past surgical history on file.   OB History    Gravida  9   Para      Term      Preterm      AB      Living  0     SAB      TAB      Ectopic      Multiple      Live Births               Home Medications    Prior to Admission medications   Medication Sig Start Date End Date Taking? Authorizing Provider  clindamycin (CLEOCIN) 300 MG capsule Take 300 mg by mouth 3 (three) times daily.   Yes [provider]  clonazePAM (KLONOPIN) 1 MG tablet Take 1 mg by mouth at bedtime.   Yes [provider]  FLUoxetine (PROZAC) 40 MG capsule Take 40 mg by mouth daily.   Yes [provider]   gabapentin (NEURONTIN) 600 MG tablet Take 600 mg by mouth 2 (two) times daily.   Yes [provider]  IRON PO Take 1 tablet by mouth as needed (low Iron).    Yes [provider]  levETIRAcetam (KEPPRA) 750 MG tablet Take 750 mg by mouth 2 (two) times daily.    Yes [provider]  topiramate (TOPAMAX) 100 MG tablet Take 100 mg by mouth 2 (two) times daily.    Yes [provider]    Family History No family history on file.  Social History Social History   Tobacco Use  . Smoking status: Never Smoker  Substance Use Topics  . Alcohol use: No  . Drug use: Yes    Types: Heroin, Other-see comments    Comment: injects "roxys"     Allergies   Aspartame; Codeine; Fish allergy; Sulfamethoxazole-trimethoprim; and Latex   Review of Systems Review of Systems  Constitutional: Negative for chills and fever.  HENT: Negative for ear pain and sore throat.   Eyes: Negative for pain and visual disturbance.  Respiratory: Negative for cough and shortness of breath.   Cardiovascular: Positive for  leg swelling (bilateral ). Negative for chest pain and palpitations.  Gastrointestinal: Negative for abdominal pain and vomiting.  Genitourinary: Negative for dysuria and hematuria.  Musculoskeletal: Positive for arthralgias. Negative for back pain.  Skin: Negative for color change and rash.  Neurological: Negative for seizures and syncope.  All other systems reviewed and are negative.    Physical Exam Updated Vital Signs BP 120/61 (BP Location: Right Arm)   Pulse 98   Temp 98.2 F (36.8 C) (Oral)   Resp 16   Ht 5\' 9"  (1.753 m)   Wt 81.6 kg   LMP 02/23/2014   SpO2 99%   BMI 26.58 kg/m   Physical Exam Vitals signs and nursing note reviewed.  Constitutional:      General: She is not in acute distress.    Appearance: She is well-developed.  HENT:     Head: Normocephalic and atraumatic.     Mouth/Throat:     Pharynx: No oropharyngeal exudate.  Eyes:      Pupils: Pupils are equal, round, and reactive to light.  Neck:     Musculoskeletal: Normal range of motion.  Cardiovascular:     Rate and Rhythm: Regular rhythm.     Heart sounds: Normal heart sounds.  Pulmonary:     Effort: Pulmonary effort is normal. No respiratory distress.     Breath sounds: Normal breath sounds.  Abdominal:     General: Bowel sounds are normal. There is no distension.     Palpations: Abdomen is soft.     Tenderness: There is no abdominal tenderness.  Musculoskeletal:        General: No tenderness or deformity.     Left knee: She exhibits swelling, effusion and erythema. She exhibits normal range of motion.     Right lower leg: No edema.     Left lower leg: No edema.       Legs:     Comments: Pulses present, sensation is intact. Limited ROM due to swelling. Strength 5/5 with dorsiflexion and plantarflexion.   Skin:    General: Skin is warm and dry.  Neurological:     Mental Status: She is alert and oriented to person, place, and time.          ED Treatments / Results  Labs (all labs ordered are listed, but only abnormal results are displayed) Labs Reviewed  CBC WITH DIFFERENTIAL/PLATELET - Abnormal; Notable for the following components:      Result Value   WBC 3.7 (*)    Hemoglobin 11.1 (*)    HCT 34.8 (*)    Neutro Abs 1.2 (*)    All other components within normal limits  COMPREHENSIVE METABOLIC PANEL - Abnormal; Notable for the following components:   Sodium 132 (*)    Potassium 3.3 (*)    Calcium 8.4 (*)    Albumin 3.0 (*)    AST 55 (*)    ALT 50 (*)    All other components within normal limits  CK  HCG, SERUM, QUALITATIVE    EKG None  Radiology Dg Knee 2 Views Left  Result Date: 07/06/2018 CLINICAL DATA:  Pain with recent fall EXAM: LEFT KNEE - 1-2 VIEW COMPARISON:  None. FINDINGS: Frontal and lateral views were obtained. No appreciable fracture or dislocation. No joint effusion. Joint spaces appear normal. No erosive change.  IMPRESSION: No fracture or dislocation. No appreciable joint effusion. No appreciable arthropathy. Electronically Signed   By: Bretta Bang III M.D.   On: 07/06/2018 16:16  Vas [place] Lower Extremity Venous (dvt) (only [facility])  Result Date: 07/06/2018  Lower Venous Study Indications: Pain, Swelling, and Cellulitis.  Limitations: Edema and pain with touch. Comparison Study: No prior study on file Performing Technologist: Sherren Kerns RVS  Examination Guidelines: A complete evaluation includes B-mode imaging, spectral Doppler, color Doppler, and power Doppler as needed of all accessible portions of each vessel. Bilateral testing is considered an integral part of a complete examination. Limited examinations for reoccurring indications may be performed as noted.  Right Venous Findings: +---------+---------------+---------+-----------+----------+--------------+          CompressibilityPhasicitySpontaneityPropertiesSummary        +---------+---------------+---------+-----------+----------+--------------+ CFV      Full           Yes      Yes                                 +---------+---------------+---------+-----------+----------+--------------+ SFJ      Full                                                        +---------+---------------+---------+-----------+----------+--------------+ FV Prox  Full                                                        +---------+---------------+---------+-----------+----------+--------------+ FV Mid   Full                                                        +---------+---------------+---------+-----------+----------+--------------+ FV DistalFull                                                        +---------+---------------+---------+-----------+----------+--------------+ PFV      Full                                                        +---------+---------------+---------+-----------+----------+--------------+ POP       Full           Yes      Yes                                 +---------+---------------+---------+-----------+----------+--------------+ PTV                                                   Not visualized +---------+---------------+---------+-----------+----------+--------------+ PERO  Not visualized +---------+---------------+---------+-----------+----------+--------------+  Left Venous Findings: +---------+---------------+---------+-----------+----------+--------------+          CompressibilityPhasicitySpontaneityPropertiesSummary        +---------+---------------+---------+-----------+----------+--------------+ CFV      Full           Yes      Yes                                 +---------+---------------+---------+-----------+----------+--------------+ SFJ      Full                                                        +---------+---------------+---------+-----------+----------+--------------+ FV Prox  Full                                                        +---------+---------------+---------+-----------+----------+--------------+ FV Mid   Full                                                        +---------+---------------+---------+-----------+----------+--------------+ FV DistalFull                                                        +---------+---------------+---------+-----------+----------+--------------+ PFV      Full                                                        +---------+---------------+---------+-----------+----------+--------------+ POP      Full           Yes      Yes                                 +---------+---------------+---------+-----------+----------+--------------+ PTV                                                   Not visualized +---------+---------------+---------+-----------+----------+--------------+ PERO                                                   Not visualized +---------+---------------+---------+-----------+----------+--------------+    Summary: Right: There is no evidence of deep vein thrombosis in the lower extremity. However, portions of this examination were limited- see technologist comments above. Left: There is no evidence of deep vein thrombosis in the lower extremity. However, portions of this examination were  limited- see technologist comments above.  *See table(s) above for measurements and observations. Electronically signed by Waverly Ferrarihristopher Dickson MD on 07/06/2018 at 5:08:04 PM.    Final     Procedures Procedures (including critical care time)  Medications Ordered in ED Medications - No data to display   Initial Impression / Assessment and Plan / ED Course  I have reviewed the triage vital signs and the nursing notes.  Pertinent labs & imaging results that were available during my care of the patient were reviewed by me and considered in my medical decision making (see chart for details).     Presents with bilateral leg swelling for the past 2 months, she [facility], previous history of hep C has not followed up for any treatment.  Reports the swelling has worsened after she was discharged from the hospital with outpatient therapy.  Patient apparently received IV clindamycin while in the hospital.  Reports the swelling has worsened after she started outpatient therapy, reports pain with walking.  Ultrasound ordered to rule out any DVT, negative for any DVT at this time.  Patient reports left knee swelling as well as pain with ambulation obtain a x-ray.  X-ray negative for any fracture or dislocation. CMP was order and sent to the lab around 3 PM, I personally called lab around 7 PM to find out why blood had not been resulted, lab personnel advises the blood was never received.  At this time I have apologized the patient will recollect blood and  sent to lab.   7:20 PM Second set of blood work sent, called lab they still have not received blood.   8:27 PM Ria from lab was contacted, blood still has not been received after 8 hours of waiting in the emergency department.   Several attempts to obtain blood from the lab, patient's blood have been mislabeled twice during the course of 10 hours, safety sewn report submitted at this time.  CBC showed no leukocytosis, I believe this is unlikely to be cellulitis as patient is afebrile believe this is likely coming from the liver as patient has a history of hep C and has not been started on any medication since diagnosis.  We will provide her with a referral to infectious disease for her to set up an appointment and obtain care.  Patient understanding with husband of results.  CMP within normal limits, at this time will discharge patient with infectious disease referral.  Final Clinical Impressions(s) / ED Diagnoses   Final diagnoses:  Peripheral edema    ED Discharge Orders    None       Claude MangesSoto, Denissa Cozart, PA-C 07/06/18 2200    Loren RacerYelverton, David, MD 07/07/18 [phone]

## 2018-07-06 NOTE — ED Triage Notes (Signed)
Pt here with c/o bil leg swelling  Times 2 months , pt was just recently at [place] [facility] and [facility]

## 2018-07-06 NOTE — Progress Notes (Signed)
VASCULAR LAB PRELIMINARY  PRELIMINARY  PRELIMINARY  PRELIMINARY  Bilateral lower extremity venous duplex completed.    Preliminary report in CV proc  Called results to Dr. Lafayette Dragon, [facility], RVT 07/06/2018, 3:44 PM

## 2018-07-06 NOTE — ED Notes (Addendum)
Pt does have Hep C and has not sought treatment

## 2018-07-06 NOTE — ED Notes (Signed)
Transported to vascular. 

## 2018-07-06 NOTE — ED Notes (Signed)
Pt returned to room from vascular.

## 2018-07-06 NOTE — ED Notes (Signed)
Labwork walked to [employer] at 21:18 and hand delivered to lab technician.

## 2018-07-06 NOTE — Discharge Instructions (Addendum)
Provided a referral to the infectious disease centers of [facility], please call to schedule an appointment.  Also provided a referral to the community health and wellness clinic if you need a primary care physician to see you in the upcoming weeks.  Develop any fever, chills, worsening redness please return to the ED.
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
## 2018-07-10 ENCOUNTER — Ambulatory Visit (INDEPENDENT_AMBULATORY_CARE_PROVIDER_SITE_OTHER): Payer: Medicaid Other | Admitting: Licensed Clinical Social Worker

## 2018-07-10 ENCOUNTER — Ambulatory Visit (INDEPENDENT_AMBULATORY_CARE_PROVIDER_SITE_OTHER): Payer: Medicaid Other | Admitting: Infectious Diseases

## 2018-07-10 ENCOUNTER — Encounter: Payer: Self-pay | Admitting: Infectious Diseases

## 2018-07-10 ENCOUNTER — Telehealth: Payer: Self-pay | Admitting: *Deleted

## 2018-07-10 VITALS — BP 133/84 | HR 98 | Temp 98.5°F | Wt 186.4 lb

## 2018-07-10 DIAGNOSIS — M79671 Pain in right foot: Secondary | ICD-10-CM

## 2018-07-10 DIAGNOSIS — F1120 Opioid dependence, uncomplicated: Secondary | ICD-10-CM

## 2018-07-10 DIAGNOSIS — M79672 Pain in left foot: Secondary | ICD-10-CM

## 2018-07-10 DIAGNOSIS — B182 Chronic viral hepatitis C: Secondary | ICD-10-CM | POA: Diagnosis present

## 2018-07-10 HISTORY — DX: Chronic viral hepatitis C: B18.2

## 2018-07-10 NOTE — Assessment & Plan Note (Signed)
Referral to Suboxone clinic and our counselor today to re-engage promptly in treatment for this. She is motivated to stop and fearful for her health if she continues.

## 2018-07-10 NOTE — Telephone Encounter (Signed)
We will attempt to reach her as well to help her make contact with you.

## 2018-07-10 NOTE — Assessment & Plan Note (Signed)
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 There is no evidence of an infectious process going on in my opinion.

## 2018-07-10 NOTE — Progress Notes (Signed)
HPI: Elizabeth Clarke is a 33 y.o. female who presents to the [facility] pharmacy clinic to initiate medication for Hepatitis C.   Patient Active Problem List   Diagnosis Date Noted  . Opioid use disorder, severe, dependence (HCC) 10/11/2014  . Opiate abuse, episodic (HCC)     Patient's Medications  New Prescriptions   No medications on file  Previous Medications   CLINDAMYCIN (CLEOCIN) 300 MG CAPSULE    Take 300 mg by mouth 3 (three) times daily.   CLONAZEPAM (KLONOPIN) 1 MG TABLET    Take 1 mg by mouth at bedtime.   FLUOXETINE (PROZAC) 40 MG CAPSULE    Take 40 mg by mouth daily.   GABAPENTIN (NEURONTIN) 600 MG TABLET    Take 600 mg by mouth 2 (two) times daily.   IRON PO    Take 1 tablet by mouth as needed (low Iron).    LEVETIRACETAM (KEPPRA) 750 MG TABLET    Take 750 mg by mouth 2 (two) times daily.    TOPIRAMATE (TOPAMAX) 100 MG TABLET    Take 100 mg by mouth 2 (two) times daily.   Modified Medications   No medications on file  Discontinued Medications   No medications on file    Allergies: Allergies  Allergen Reactions  . Aspartame Anaphylaxis and Hives  . Codeine Hives and Swelling    Throat swelling. Throat swells up Throat swells up   . Fish Allergy Anaphylaxis  . Sulfamethoxazole-Trimethoprim Rash  . Latex Rash    Past Medical History: Past Medical History:  Diagnosis Date  . Diabetes mellitus without complication (HCC)   . Seizures (HCC)     Social History: Social History   Socioeconomic History  . Marital status: Single    Spouse name: Not on file  . Number of children: Not on file  . Years of education: Not on file  . Highest education level: Not on file  Occupational History  . Not on file  Social Needs  . Financial resource strain: Not on file  . Food insecurity:    Worry: Not on file    Inability: Not on file  . Transportation needs:    Medical: Not on file    Non-medical: Not on file  Tobacco Use  . Smoking status: Never Smoker  .  Smokeless tobacco: Never Used  Substance and Sexual Activity  . Alcohol use: No  . Drug use: Yes    Types: Heroin, Other-see comments    Comment: injects "roxys"  . Sexual activity: Yes  Lifestyle  . Physical activity:    Days per week: Not on file    Minutes per session: Not on file  . Stress: Not on file  Relationships  . Social connections:    Talks on phone: Not on file    Gets together: Not on file    Attends religious service: Not on file    Active member of club or organization: Not on file    Attends meetings of clubs or organizations: Not on file    Relationship status: Not on file  Other Topics Concern  . Not on file  Social History Narrative  . Not on file    Labs: Hepatitis C No results found for: HCVGENOTYPE, HEPCAB, HCVRNAPCRQN, FIBROSTAGE Hepatitis B No results found for: HEPBSAB, HEPBSAG, HEPBCAB Hepatitis A No results found for: HAV HIV No results found for: HIV Lab Results  Component Value Date   CREATININE 0.65 07/06/2018   CREATININE 0.53 10/11/2014  CREATININE 0.50 10/10/2014   Lab Results  Component Value Date   AST 55 (H) 07/06/2018   AST 23 10/10/2014   ALT 50 (H) 07/06/2018   ALT 20 10/10/2014    Assessment: Elizabeth Clarke is here today for an initial appointment with Judeth Cornfield to discuss starting treatment for Hepatitis C. Will likely initiate Mavyret pending labwork. Discussed Mavyret with Elizabeth Clarke today. Counseled on taking three tablets at the same time once daily with food for 8 weeks. Discussed the importance of adherence to the medication in order to cure her Hepatitis C. Explained that the efficacy of treatment is reduced with each missed dose. Suggested setting an alarm or taking with a consistent meal each day to help remember to take the medication. Counseled on possible side effects including fatigue, headaches, and nausea that should wane over time. Discussed using OTC ibuprofen or Tylenol to help with headaches should they occur.  Reviewed her medications and note no drug interactions at this time.  Elizabeth Clarke has Medicaid, so filled out Medicaid readiness form with her today. She currently uses IV heroin, with her last use on 07/09/2018. She does not use any other controlled substances and reports that she is ready to quit. Will set her up to see Rene Kocher today to schedule her with follow-up counseling sessions.    Plan: -Fill out Medicaid readiness form -Will send HepC medication when labs are back, probably Mavyret   Lawernce Keas [facility] 07/10/2018, 11:18 AM

## 2018-07-10 NOTE — Assessment & Plan Note (Signed)
New Patient with Chronic Hepatitis C genotype unknown, attempted 1 week of treatment (likely Harvoni) in the past. We will need labs to establish chronic active infection. Her LFTs recently are expectedly elevated consistent with chronic hepatitis c. Has had some thrombocytopenia in the past but current values normal range.   I discussed with the patient the lab findings that confirm chronic hepatitis C as well as the natural history and progression of disease including about 30% of people who develop cirrhosis of the liver if left untreated and once cirrhosis is established there is a 2-7% risk per year of liver cancer and liver failure.  I discussed the importance of treatment and benefits in reducing the risk, even if significant liver fibrosis exists. I also discussed risk for re-infection following treatment should he not continue to modify risk factors.    Patient counseled extensively on limiting acetaminophen to no more than 2 grams daily, avoidance of alcohol.  Transmission discussed with patient including sexual transmission, sharing razors and toothbrush.   Will need referral to gastroenterology if concern for cirrhosis  I have referred her to Suboxone Clinic to re-engage in replacement therapy. She will meet with our mental health team here today.   Will seek approval for Mavyret - there is no interaction with her seizure medications and Medicaid preferred.   Hepatitis A and B titers to be drawn today with appropriate vaccinations as needed   Pneumovax vaccine counseling discussed - declined.   Further work up to include liver staging through non-invasive serum analysis with APRI and FIB4 scores and Liver Fibrosis panel; U/S to follow if discordant or concerning results.  Will call [place] back once all results are in and counsel on medication over the phone. Will proceed with elastography with her lower extremity edema, history of thrombocytopenia and low albumin levels.  It is unlikely she has cirrhosis or advanced fibrosis however with these findings will work up and stage with imaging.

## 2018-07-10 NOTE — Telephone Encounter (Signed)
We can try to see her.  I reviewed her chart and she is very high risk, likely needs a higher level of care given history.  We can evaluate X 1, have her see Phineas Semen at the same time (please schedule her with Phineas Semen immediately after our intake visit).    Thanks

## 2018-07-10 NOTE — Telephone Encounter (Signed)
Attempted to reach pt again, got same recording, will try Tuesday am

## 2018-07-10 NOTE — Telephone Encounter (Signed)
Attempted to call pt, got a recording that would not let me leave a message Will send email to dr Criselda Peaches, to seek her approval of scheduling pt

## 2018-07-10 NOTE — BH Specialist Note (Signed)
  Integrated Behavioral Health Initial Visit  MRN: [id number] Name: Elizabeth Clarke  Number of Integrated Behavioral Health Clinician visits:: 1/6 Session Start time: 11:12am  Session End time: 11:33am Total time: 20 minutes  Type of Service: Integrated Behavioral Health- Individual/Family Interpretor:No. Interpretor Name and Language: n/a   Warm Hand Off Completed.       SUBJECTIVE: Elizabeth Clarke is a 33 y.o. female accompanied by self Patient was referred by Elizabeth Clarke for grief and substance abuse. Patient reports the following symptoms/concerns: several recent losses, crying spells, relapse on heroin Duration of problem: unknown; Severity of problem: severe  OBJECTIVE: Mood: Depressed and Affect: Depressed Risk of harm to self or others: No plan to harm self or others  LIFE CONTEXT: Patient reports that the months of September - January are always hard for her due to a string of losses over this time. She indicates that each year she relapses sometime in this timeframe, and recently used heroin and methamphetamines again as a result of feeling overwhelmed by depression. Patient has seizures and is unable to drive, so her boyfriend transports her places but is often out of town for his job.   GOALS ADDRESSED: Patient will: 1. Reduce symptoms of: depression and substance abuse  INTERVENTIONS: Interventions utilized: Motivational Interviewing and Supportive Counseling   ASSESSMENT: Patient currently experiencing recent relapse, anniversaries of deaths, crying spells, depressed mood and flat affect. The most appropriate diagnosis at this time is Opioid Dependence.  Patient reports that beginning in 2014 she lost important people in her life every couple of months from September to January, including her brother (to suicide), her mother, her best friend (shot by husband), and two sisters (both overdoses). She states that during these months she often relapses and has  had suicide attempts in the past. She denies current suicidal thoughts, but wants to have support and figure out how to move forward. Counselor educated patient on counseling services available at [facility], including scope of practice, scheduling practices and financial accessibility.    Patient may benefit from ongoing counseling for grief and substance abuse.  PLAN: 1. Follow up with behavioral health clinician on : 07/20/18  Angus Palms, LCSW

## 2018-07-10 NOTE — Telephone Encounter (Signed)
-----   Message from Blanchard KelchStephanie N Dixon, NP sent at 07/10/2018 12:21 PM EST ----- Regarding: Suboxone Referral Hi Myriam JacobsonHelen,   I wanted to see if we can get this nice lady in for suboxone therapy. She has hep c that I am helping her with but she is currently still using to avoid withdrawal. Several previous ODs and one suicide attempt in October with intentional OD...   She did really well for 18 months on previous replacement therapy and I wanted to send a message personally as I am concerned over her safety. We have her seeing our counselor for now to help with her depression/OUD but hopeful you all are able to help her soon.   Many thanks,  Rexene AlbertsStephanie Dixon, NP  [facility]

## 2018-07-10 NOTE — Patient Instructions (Signed)
Nice to meet you today!    We need to get a little more information about your hepatitis c infection before we start your treatment. I anticipate that we can get you started in a few weeks.    Until we can get you treated would recommend: - condoms with sexual encounters or abstinence - no sharing of razors, toothbrushes or anything that could potentially have blood on it.  - limit alcohol to as little as possible to less than 1 drink a day  - limit tylenol use to less than 2,000 mg daily  - clean up all spilled blood with bleach solution (1:10) even dried blood   Will call you with ultrasound results.   Please stop by our lab and see our referral coordinator to get your appointments set up with our coordinator for your ultrasound and schedule an appointment with suboxone clinic. Neurology will contact you.   We will see you back with our pharmacy team 4 weeks after you start your first pill.

## 2018-07-13 NOTE — Telephone Encounter (Signed)
Morrie Sheldon if you have time today will you try to reach out to Ms. Tallerico? I am trying to get her urgently in to a suboxone treatment clinic as she is high risk for overdose. She is a new hepatitis c patient I am working with. Marin Roberts (suboxone program lead RN) has been trying to reach her but I wanted to see if we could try as well.   Thank you kindly.

## 2018-07-14 NOTE — Telephone Encounter (Signed)
Attempted to call patient to get her in touch with Marin Roberts (Suboxone treatment lead RN)  Unable to leave a message got a recording and phone went silent.  There is no voicemail.  Will try again tomorrow 07/15/2018. Angeline Slim RN

## 2018-07-15 LAB — LIVER FIBROSIS, FIBROTEST-ACTITEST
ALT: 47 U/L — AB (ref 6–29)
APOLIPOPROTEIN A1: 126 mg/dL (ref 101–198)
Alpha-2-Macroglobulin: 264 mg/dL (ref 106–279)
Bilirubin: 0.5 mg/dL (ref 0.2–1.2)
Fibrosis Score: 0.2
GGT: 14 U/L (ref 3–50)
Haptoglobin: 101 mg/dL (ref 43–212)
NECROINFLAMMAT ACT SCORE: 0.24
Reference ID: 2814446

## 2018-07-15 LAB — PROTIME-INR
INR: 0.9
PROTHROMBIN TIME: 9.9 s (ref 9.0–11.5)

## 2018-07-15 LAB — HEPATITIS C RNA QUANTITATIVE
HCV QUANT LOG: 6.66 {Log_IU}/mL — AB
HCV RNA, PCR, QN: 4550000 IU/mL — ABNORMAL HIGH

## 2018-07-15 LAB — HEPATITIS B CORE ANTIBODY, TOTAL: HEP B C TOTAL AB: NONREACTIVE

## 2018-07-15 LAB — HIV ANTIBODY (ROUTINE TESTING W REFLEX): HIV 1&2 Ab, 4th Generation: NONREACTIVE

## 2018-07-15 LAB — HEPATITIS C GENOTYPE: HCV Genotype: 3

## 2018-07-15 LAB — HEPATITIS B SURFACE ANTIBODY,QUALITATIVE: Hep B S Ab: NONREACTIVE

## 2018-07-15 LAB — HEPATITIS A ANTIBODY, TOTAL: Hepatitis A AB,Total: REACTIVE — AB

## 2018-07-15 LAB — HEPATITIS B SURFACE ANTIGEN: HEP B S AG: NONREACTIVE

## 2018-07-15 NOTE — Telephone Encounter (Signed)
I have called pt ph again, got recording telling me to call again, no message ability Elizabeth Clarke we can see her 1/28 at 1030, she will need directions to get to our office since it is a bit hard to get here from front entrance you may call me directly at [phone]

## 2018-07-15 NOTE — Telephone Encounter (Addendum)
Attempted to call patient to get her in touch with Marin Roberts (Suboxone treatment lead RN)  Unable to leave a message got a recording and phone went silent.  There is no voicemail.  Will try again tomorrow 07/16/2018.  Will let Rene Kocher know we need to speak with her to get her set up with Marin Roberts when she shows up for her appointment. Angeline Slim RN

## 2018-07-16 ENCOUNTER — Ambulatory Visit (HOSPITAL_COMMUNITY): Payer: Medicaid Other

## 2018-07-16 NOTE — Telephone Encounter (Signed)
Thank you for the update and the effort, Elizabeth Clarke. She has an appointment with our counselor, Rene Kocher on the 27th and will also reinforce to get in touch with you and your team.

## 2018-07-16 NOTE — Telephone Encounter (Signed)
Attempted to call pt again, a lady answered the phone and stated she would give pt a message, left ph# and my name only. Would like to speak w/ her to confirm appt tues

## 2018-07-20 ENCOUNTER — Ambulatory Visit: Payer: Medicaid Other | Admitting: Behavioral Health

## 2018-07-20 ENCOUNTER — Telehealth: Payer: Self-pay

## 2018-07-20 ENCOUNTER — Ambulatory Visit (INDEPENDENT_AMBULATORY_CARE_PROVIDER_SITE_OTHER): Payer: Medicaid Other | Admitting: Licensed Clinical Social Worker

## 2018-07-20 DIAGNOSIS — F1120 Opioid dependence, uncomplicated: Secondary | ICD-10-CM | POA: Diagnosis not present

## 2018-07-20 NOTE — Telephone Encounter (Signed)
Ok. That is fine, will see her tomorrow. Lets block any remaining spots because that morning looks full now.

## 2018-07-20 NOTE — Telephone Encounter (Signed)
Have blocked all other slots

## 2018-07-20 NOTE — Telephone Encounter (Signed)
Spoke w/ pt, appt tues 1/28 oud

## 2018-07-20 NOTE — Telephone Encounter (Signed)
Pt missed call, pls return call [phone]

## 2018-07-20 NOTE — BH Specialist Note (Signed)
Integrated Behavioral Health Follow Up Visit  MRN: [id number] Name: Elizabeth Clarke  Number of Integrated Behavioral Health Clinician visits: 2/6 Session Start time: 3:50pm  Session End time: 4:40 pm Total time: 50 minutes  Type of Service: Integrated Behavioral Health- Individual/Family Interpretor:No. Interpretor Name and Language: n/a  SUBJECTIVE: Elizabeth Clarke is a 33 y.o. female accompanied by self Patient reports the following symptoms/concerns: anxiety about upcoming anniversary (brother's death), temptation to use   OBJECTIVE: Mood: Anxious and Affect: Tearful Risk of harm to self or others: No plan to harm self or others (reports she often feels like it would be better if she were dead, but recently she has wanted to live instead)  LIFE CONTEXT: Patient reports that Wednesday (January 29) is the anniversary of her brother's suicide. She states that this is one of the days when she usually relapses, and that if she does not stay busy the temptation takes over. Currently patient has doctor's appointments tomorrow (January 28) but has been unable to schedule any for the actual anniversary. Patient's boyfriend is leaving today for a 2 week work assignment, but his 30 year old son will be at home and dependent on her at 3pm on Wednesday.   GOALS ADDRESSED: Patient will: 1.  Reduce symptoms of: substance abuse   INTERVENTIONS: Interventions utilized:  Motivational Interviewing and Supportive Counseling and Relapse Prevention Therapy  ASSESSMENT: Patient currently experiencing anxiety about upcoming anniversary of her brother's death. Counselor guided patient in processing past anniversaries and how they have played out. Patient identified that being alone and not having anything to do led her to relapse the last two anniversaries, and expressed fear that she cannot find anything to do this year. Counselor and patient brainstormed ideas; patient's supportive people are not  available for her that day. Counselor found a peer support group meeting on that day, and offered to see patient again to check in with her on that morning. Patient identified that having appointments will increase her sense of accountability and she will be more likely to follow through with sobriety. She also shared that she has an appointment tomorrow with the Suboxone  Clinic, but she is not sure how that process will go. Counselor and patient explored ways and places that patient can seek motivation. Patient reports that her faith is a support, and that her daughter and step son are main motivators. Counselor and patient explored ways to remind and inspire herself using these motivators.    Patient may benefit from ongoing weekly Relapse Prevention Therapy sessions, and an extra session this week due to triggering anniversary.  PLAN: 1. Follow up with behavioral health clinician on : 07/19/18 at 10:30am   Angus Palms, LCSW

## 2018-07-20 NOTE — Telephone Encounter (Signed)
Called spoke w/ pt, scheduled for 1/28 in OUD, she also is looking for pcp care , ask her to take some time to ask for pcp care, see iff she is comfortable in [facility] and get started on suboxone first Released from prison appr 1 yr ago Entire family is addicted and deals Brother committed suicide and this weighs on her very heavily, tomorrow is anniversary of his death She has lost 2 sisters to overdose Has no contact with remaining family States she has good support system now but does continue to use, choice is heroin but will use meth to ease heroin craving and ease withdrawal Has 90 yr old daughter and wants to "get clean" for her.  Has been in treatment at least 1x time before in [place] she thinks a dr Yehuda Mao.

## 2018-07-20 NOTE — Progress Notes (Signed)
[place] came in and spoke to the nurse.  She states she was already aware of her appointment tomorrow with Internal medicine for Suboxone treatment referral.   Angeline Slim RN

## 2018-07-21 ENCOUNTER — Encounter: Payer: Self-pay | Admitting: Student in an Organized Health Care Education/Training Program

## 2018-07-21 ENCOUNTER — Ambulatory Visit (HOSPITAL_COMMUNITY): Payer: Medicaid Other

## 2018-07-21 ENCOUNTER — Ambulatory Visit (HOSPITAL_COMMUNITY): Admission: RE | Admit: 2018-07-21 | Payer: Medicaid Other | Source: Ambulatory Visit

## 2018-07-21 ENCOUNTER — Telehealth: Payer: Self-pay | Admitting: Behavioral Health

## 2018-07-21 NOTE — Telephone Encounter (Signed)
[name] (Suboxone lead RN) called today to inform Rexene Alberts that patient showed up late to her appointment so she was not able to be seen today and had to reschedule her appointment for 07/28/2018.  Patient was with her during the call.  Patient states when she was discharged from the hospital she was given a temporary prescription for Suboxone.  Dr. Criselda Peaches has not seen the patient so Dr. Criselda Peaches will not write the prescription until patient establishs care.  [name] is wondering if Judeth Cornfield could write a temporary prescription until she is able to see Dr. Criselda Peaches, or advice for patient. Angeline Slim RN

## 2018-07-21 NOTE — Telephone Encounter (Signed)
I have reviewed both her chart and the [facility] with no previous prescriptions for Suboxone. It appears this would be a new start and would need to establish care for this.

## 2018-07-21 NOTE — Telephone Encounter (Signed)
Agree with Elizabeth Clarke, managing a new start is different than bridging prescription. I would encourage her to ensure she can follow up with Dr. Criselda Clarke as arranged so we can get her on treatment as scheduled. She also has follow up with Elizabeth Clarke scheduled January 29th that I hope she can make to give some social support.   Thank you everyone for your efforts.

## 2018-07-21 NOTE — Telephone Encounter (Signed)
Unfortunately I never sent in my request to have X-license following training after I heard we weren't going to proceed further with suboxone as a clinic. I spoke with Tammy Sours who does have X-# required.  He is graciously willing to provide patient with 1 weeks subscription to bridge to next appointment.

## 2018-07-21 NOTE — Telephone Encounter (Signed)
Called Marin Roberts at Internal medicine to inform her patient did not have a recent prescription for Suboxone and would still need to to establish care.  She states patient has to make it to 07/28/2018 10:45 appointment or her referral will be closed.  Informed Mancel Bale and patient would be made aware.  Called patient to inform her that she has a rescheduled appointment with Dr. Criselda Peaches to establish care for Suboxone treatment.  Patient states she was aware of appointment.  Asked patient which doctor filled her temporary prescription for Suboxone and she stated it was some place in [place] but then she went on to say she had not had Suboxone in 2.5 years.  She states when she talked to myself and [name] on the phone earlier today she was not talking about Suboxone.  Patient states she needs a refill on her Gabapentin for restless legs and her Prozac.  (RN called [name] RN back and [name] and RN verified patient was asking for a temporary Suboxone prescription).  Patient is now requesting a Gabapentin and Prozac refill from [facility].  Patient states she is unable to sleep and states her family can tell she has not been talking her Prozac. Angeline Slim RN

## 2018-07-22 ENCOUNTER — Ambulatory Visit: Payer: Medicaid Other | Admitting: Licensed Clinical Social Worker

## 2018-07-23 ENCOUNTER — Ambulatory Visit: Payer: Self-pay | Admitting: Licensed Clinical Social Worker

## 2018-07-28 ENCOUNTER — Telehealth: Payer: Self-pay | Admitting: *Deleted

## 2018-07-28 ENCOUNTER — Other Ambulatory Visit: Payer: Self-pay

## 2018-07-28 ENCOUNTER — Ambulatory Visit (INDEPENDENT_AMBULATORY_CARE_PROVIDER_SITE_OTHER): Payer: Medicaid Other | Admitting: Internal Medicine

## 2018-07-28 ENCOUNTER — Ambulatory Visit: Payer: Medicaid Other | Admitting: Licensed Clinical Social Worker

## 2018-07-28 VITALS — BP 140/89 | HR 93 | Temp 99.1°F | Ht 69.0 in | Wt 190.9 lb

## 2018-07-28 DIAGNOSIS — F1120 Opioid dependence, uncomplicated: Secondary | ICD-10-CM | POA: Diagnosis present

## 2018-07-28 MED ORDER — BUPRENORPHINE HCL-NALOXONE HCL 8-2 MG SL FILM: 1.0000 | ORAL_FILM | Freq: Two times a day (BID) | SUBLINGUAL | 0 refills | Status: DC

## 2018-07-28 NOTE — Assessment & Plan Note (Signed)
Home induction with Suboxone-- will start with 1/2 of a film (8-2mg  film).  She will plan to titrate up to 2 films per day to control cravings.  Will have her back for office visit in 1 week.
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
## 2018-07-28 NOTE — Telephone Encounter (Signed)
Patient missed today's appointment, states her father is being rushed to the hospital for a suspected heart attack.  She would like Rene Kocher to give her call tomorrow to reschedule this appointment. Andree Coss, RN

## 2018-07-28 NOTE — Patient Instructions (Signed)

## 2018-07-30 ENCOUNTER — Other Ambulatory Visit: Payer: Self-pay | Admitting: Pharmacist

## 2018-07-30 DIAGNOSIS — B182 Chronic viral hepatitis C: Secondary | ICD-10-CM

## 2018-07-30 MED ORDER — GLECAPREVIR-PIBRENTASVIR 100-40 MG PO TABS: 3.0000 | ORAL_TABLET | Freq: Every day | ORAL | 1 refills | Status: AC

## 2018-07-30 NOTE — Progress Notes (Signed)
Patient's labs are back. She has genotype 3, F0, and an initial Hep C viral load of 4.5 million. Will send in Mavyret x 8 weeks and see if Medicaid will approve it.

## 2018-07-31 ENCOUNTER — Telehealth: Payer: Self-pay | Admitting: Pharmacist

## 2018-07-31 ENCOUNTER — Telehealth: Payer: Self-pay | Admitting: Pharmacy Technician

## 2018-07-31 ENCOUNTER — Encounter: Payer: Self-pay | Admitting: Pharmacy Technician

## 2018-07-31 NOTE — Telephone Encounter (Signed)
Patient is approved to receive Mavyret x 8 weeks for chronic Hepatitis C infection. Counseled patient to take all three tablets of Mavyret daily with food.  Counseled patient the need to take all three tablets together and to not separate them out during the day. Encouraged patient not to miss any doses and explained how their chance of cure could go down with each dose missed. Counseled patient on what to do if dose is missed - if it is closer to the missed dose take immediately; if closer to next dose then skip dose and take the next dose at the usual time.   Counseled patient on common symptoms including headache, fatigue, and nausea and that the symptoms normally decrease with time. I reviewed patient medications and found no drug interactions. Discussed with patient that there are several drug interactions with Mavyret and instructed patient to call the clinic if she wishes to start a new medication during course of therapy. Also advised patient to call if she experiences any side effects. Patient will follow-up with me in the pharmacy clinic on 3/10.

## 2018-07-31 NOTE — Telephone Encounter (Signed)
[facility] Patient Advocate Encounter   [place] [facility] [place] [facility] [facility] [place] Mavyret [place] required.   PA submitted on 07/31/2018  Key [id number] W Status is pending    [facility] will continue to follow.  Elizabeth Clarke, CPhT Specialty Pharmacy Patient [facility] Phone: [phone] Fax: [phone] 07/31/2018 11:51 AM

## 2018-07-31 NOTE — Telephone Encounter (Signed)
[facility] Patient Advocate Encounter  Prior Authorization for Mavyret has been approved.    Effective dates: 07/31/2018 through 09/29/2018  Patients co-pay is $3.00.   [facility] will continue to follow.  Beulah Gandy, CPhT Specialty Pharmacy Patient [facility] for Infectious Disease Phone: [phone] Fax: [phone] 07/31/2018 2:13 PM

## 2018-07-31 NOTE — Telephone Encounter (Signed)
[facility] Patient Advocate Encounter  Spoke with patient about the Production assistant, radio.  She will pick up Monday, August 03, 2018 at the [facility] and will start the medication that day. Follow-up appointment early March.  Patient had no further questions.  Beulah Gandy, CPhT Specialty Pharmacy Patient [facility] for Infectious Disease Phone: [phone] Fax: [phone] 07/31/2018 3:12 PM

## 2018-07-31 NOTE — Telephone Encounter (Signed)
Excellent thank you so much - glad she is having things fall into place for her with regards to HCV treatment and Suboxone.

## 2018-08-01 LAB — TOXASSURE SELECT,+ANTIDEPR,UR

## 2018-08-03 MED FILL — MAVYRET 100-40 MG TABS: 100-40 | 28 days supply | Qty: 84 | Fill #0

## 2018-08-11 ENCOUNTER — Encounter: Payer: Self-pay | Admitting: Internal Medicine

## 2018-08-11 ENCOUNTER — Other Ambulatory Visit: Payer: Self-pay | Admitting: Internal Medicine

## 2018-08-11 ENCOUNTER — Encounter: Payer: Self-pay | Admitting: Licensed Clinical Social Worker

## 2018-08-11 ENCOUNTER — Ambulatory Visit: Payer: Medicaid Other | Admitting: Internal Medicine

## 2018-08-11 ENCOUNTER — Other Ambulatory Visit (HOSPITAL_COMMUNITY)
Admission: RE | Admit: 2018-08-11 | Discharge: 2018-08-11 | Disposition: A | Discharge status: Home / Self Care | Payer: Medicaid Other | Source: Ambulatory Visit | Attending: Student in an Organized Health Care Education/Training Program | Admitting: Student in an Organized Health Care Education/Training Program

## 2018-08-11 ENCOUNTER — Ambulatory Visit (INDEPENDENT_AMBULATORY_CARE_PROVIDER_SITE_OTHER): Payer: Medicaid Other | Admitting: Internal Medicine

## 2018-08-11 ENCOUNTER — Telehealth: Payer: Self-pay | Admitting: Internal Medicine

## 2018-08-11 ENCOUNTER — Other Ambulatory Visit: Payer: Self-pay

## 2018-08-11 ENCOUNTER — Ambulatory Visit (INDEPENDENT_AMBULATORY_CARE_PROVIDER_SITE_OTHER): Payer: Medicaid Other | Admitting: Licensed Clinical Social Worker

## 2018-08-11 VITALS — BP 127/79 | HR 83 | Ht 69.0 in | Wt 194.3 lb

## 2018-08-11 VITALS — BP 127/79 | HR 83 | Ht 69.0 in | Wt 194.0 lb

## 2018-08-11 DIAGNOSIS — Z8719 Personal history of other diseases of the digestive system: Secondary | ICD-10-CM | POA: Diagnosis not present

## 2018-08-11 DIAGNOSIS — A630 Anogenital (venereal) warts: Secondary | ICD-10-CM

## 2018-08-11 DIAGNOSIS — Z90710 Acquired absence of both cervix and uterus: Secondary | ICD-10-CM | POA: Diagnosis not present

## 2018-08-11 DIAGNOSIS — N760 Acute vaginitis: Secondary | ICD-10-CM | POA: Diagnosis not present

## 2018-08-11 DIAGNOSIS — M79672 Pain in left foot: Secondary | ICD-10-CM | POA: Diagnosis not present

## 2018-08-11 DIAGNOSIS — F1120 Opioid dependence, uncomplicated: Secondary | ICD-10-CM

## 2018-08-11 DIAGNOSIS — B3731 Acute candidiasis of vulva and vagina: Secondary | ICD-10-CM

## 2018-08-11 DIAGNOSIS — F329 Major depressive disorder, single episode, unspecified: Secondary | ICD-10-CM

## 2018-08-11 DIAGNOSIS — F339 Major depressive disorder, recurrent, unspecified: Secondary | ICD-10-CM

## 2018-08-11 DIAGNOSIS — B182 Chronic viral hepatitis C: Secondary | ICD-10-CM | POA: Diagnosis not present

## 2018-08-11 DIAGNOSIS — Z79899 Other long term (current) drug therapy: Secondary | ICD-10-CM | POA: Diagnosis not present

## 2018-08-11 DIAGNOSIS — K921 Melena: Secondary | ICD-10-CM | POA: Diagnosis not present

## 2018-08-11 DIAGNOSIS — N898 Other specified noninflammatory disorders of vagina: Secondary | ICD-10-CM | POA: Insufficient documentation

## 2018-08-11 DIAGNOSIS — B373 Candidiasis of vulva and vagina: Secondary | ICD-10-CM

## 2018-08-11 DIAGNOSIS — A5900 Urogenital trichomoniasis, unspecified: Secondary | ICD-10-CM | POA: Diagnosis not present

## 2018-08-11 DIAGNOSIS — R600 Localized edema: Secondary | ICD-10-CM | POA: Diagnosis present

## 2018-08-11 DIAGNOSIS — F1190 Opioid use, unspecified, uncomplicated: Secondary | ICD-10-CM

## 2018-08-11 DIAGNOSIS — B3749 Other urogenital candidiasis: Secondary | ICD-10-CM | POA: Diagnosis not present

## 2018-08-11 DIAGNOSIS — M79671 Pain in right foot: Secondary | ICD-10-CM | POA: Diagnosis not present

## 2018-08-11 DIAGNOSIS — G40909 Epilepsy, unspecified, not intractable, without status epilepticus: Secondary | ICD-10-CM

## 2018-08-11 DIAGNOSIS — R300 Dysuria: Secondary | ICD-10-CM

## 2018-08-11 DIAGNOSIS — F419 Anxiety disorder, unspecified: Secondary | ICD-10-CM

## 2018-08-11 DIAGNOSIS — F32A Depression, unspecified: Secondary | ICD-10-CM

## 2018-08-11 DIAGNOSIS — B9689 Other specified bacterial agents as the cause of diseases classified elsewhere: Secondary | ICD-10-CM

## 2018-08-11 DIAGNOSIS — A599 Trichomoniasis, unspecified: Secondary | ICD-10-CM

## 2018-08-11 LAB — CBC WITH DIFFERENTIAL/PLATELET
Abs Immature Granulocytes: 0 10*3/uL (ref 0.00–0.07)
Basophils Absolute: 0 10*3/uL (ref 0.0–0.1)
Basophils Relative: 1 %
Eosinophils Absolute: 0.2 10*3/uL (ref 0.0–0.5)
Eosinophils Relative: 4 %
HCT: 39.1 % (ref 36.0–46.0)
Hemoglobin: 12.1 g/dL (ref 12.0–15.0)
Immature Granulocytes: 0 %
LYMPHS ABS: 1.9 10*3/uL (ref 0.7–4.0)
Lymphocytes Relative: 46 %
MCH: 27.5 pg (ref 26.0–34.0)
MCHC: 30.9 g/dL (ref 30.0–36.0)
MCV: 88.9 fL (ref 80.0–100.0)
Monocytes Absolute: 0.4 10*3/uL (ref 0.1–1.0)
Monocytes Relative: 9 %
Neutro Abs: 1.6 10*3/uL — ABNORMAL LOW (ref 1.7–7.7)
Neutrophils Relative %: 40 %
Platelets: 256 10*3/uL (ref 150–400)
RBC: 4.4 MIL/uL (ref 3.87–5.11)
RDW: 13.7 % (ref 11.5–15.5)
WBC: 4.1 10*3/uL (ref 4.0–10.5)
nRBC: 0 % (ref 0.0–0.2)

## 2018-08-11 LAB — POCT URINALYSIS DIPSTICK
Glucose, UA: NEGATIVE
Nitrite, UA: NEGATIVE
Protein, UA: POSITIVE — AB
Spec Grav, UA: 1.02 (ref 1.010–1.025)
Urobilinogen, UA: >=8 E.U./dL — AB
pH, UA: 7 (ref 5.0–8.0)

## 2018-08-11 LAB — COMPREHENSIVE METABOLIC PANEL
ALT: 28 U/L (ref 0–44)
AST: 28 U/L (ref 15–41)
Albumin: 3.6 g/dL (ref 3.5–5.0)
Alkaline Phosphatase: 59 U/L (ref 38–126)
Anion gap: 8 (ref 5–15)
BUN: 13 mg/dL (ref 6–20)
CO2: 25 mmol/L (ref 22–32)
Calcium: 9.4 mg/dL (ref 8.9–10.3)
Chloride: 105 mmol/L (ref 98–111)
Creatinine, Ser: 0.66 mg/dL (ref 0.44–1.00)
GFR calc Af Amer: >60 mL/min (ref >60)
GFR calc non Af Amer: >60 mL/min (ref >60)
Glucose, Bld: 82 mg/dL (ref 70–99)
Potassium: 3.7 mmol/L (ref 3.5–5.1)
Sodium: 138 mmol/L (ref 135–145)
Total Bilirubin: 1 mg/dL (ref 0.3–1.2)
Total Protein: 7.4 g/dL (ref 6.5–8.1)

## 2018-08-11 LAB — PROTIME-INR
INR: 0.99
Prothrombin Time: 13 seconds (ref 11.4–15.2)

## 2018-08-11 MED ORDER — TOPIRAMATE 100 MG PO TABS: 100.0000 mg | ORAL_TABLET | Freq: Two times a day (BID) | ORAL | 0 refills | Status: DC

## 2018-08-11 MED ORDER — FLUOXETINE HCL 40 MG PO CAPS: 40.0000 mg | ORAL_CAPSULE | Freq: Every day | ORAL | 5 refills | Status: DC

## 2018-08-11 MED ORDER — BUPRENORPHINE HCL-NALOXONE HCL 8-2 MG SL FILM: 1.0000 | ORAL_FILM | Freq: Two times a day (BID) | SUBLINGUAL | 0 refills | Status: DC

## 2018-08-11 MED ORDER — LEVETIRACETAM 750 MG PO TABS: 750.0000 mg | ORAL_TABLET | Freq: Two times a day (BID) | ORAL | 0 refills | Status: DC

## 2018-08-11 MED ORDER — GABAPENTIN 600 MG PO TABS: 600.0000 mg | ORAL_TABLET | Freq: Two times a day (BID) | ORAL | 5 refills | Status: DC

## 2018-08-11 MED ORDER — CLONAZEPAM 1 MG PO TABS: 1.0000 mg | ORAL_TABLET | Freq: Every day | ORAL | 0 refills | Status: DC

## 2018-08-11 NOTE — Assessment & Plan Note (Signed)
Refilled Prozac 40 mg daily. ?

## 2018-08-11 NOTE — Assessment & Plan Note (Addendum)
Speculum exam was performed today due to complaint of vaginal bleeding.  She is status post hysterectomy that was done urgently after giving birth to her daughter in 2016 for excessive vaginal bleeding.  On exam she was noted to have copious amounts of white vaginal discharge.  She is sexually active with one partner, however she is not sure if her partner is monogamous.  He is a Naval architect and frequently out of town.  There is no bleeding on exam or obvious source of prior bleeding.  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  Unclear if this is causing symptoms. Will follow-up GC chlamydia and wet prep and treat accordingly.  If she remains symptomatic with bleeding or discharge or dysuria after treatment, consider referral to gynecology for further evaluation. -Follow-up wet prep, GC/ chlamydia -- HIV ab  ADDENDUM: Positive for BV, candida, and trichomonas. Called patient, left message with her father asking for a call back. Personal health care information was not shared with her father. Sent prescription for metronidazole and fluconazole to her pharmacy.

## 2018-08-11 NOTE — Assessment & Plan Note (Signed)
Patient developed lower extremity edema a few months ago that has progressively worsened.  On exam she has bilateral nonpitting edema with few superficial ulcerations.  She had a DVT Doppler ultrasound that was negative 1 month ago.  On exam there is no erythema or warmth concerning for cellulitis.  She is concerned that the swelling acutely worsened after starting Mavyret for her chronic hep C.  However given that swelling started before she initiated treatment, I suspect this is coincidental.  Lower extremity edema is not a known side effect for this medication.  Patient does have a significant family history of early coronary artery disease.  Her mother deceased from an MI in her 54s.  I doubt her edema is cardiogenic, however we will check an echocardiogram to rule this out.  If negative would check venous duplex reflux study for evaluation of likely venous insufficiency.  We discussed recommendations of leg elevation and compression which patient has tried without improvement.  --Echocardiogram - Consider venous duplex reflux study for venous insufficiency

## 2018-08-11 NOTE — Assessment & Plan Note (Signed)
Patient reports being on Klonopin 1 mg nightly for many years.  She was off the medication temporarily while incarcerated.  Review of the database only shows 1 fill of Klonopin that was dispensed on 06/22/2019 for 15 tablets after hospitalization.  Patient is able to list multiple pharmacies where she has had her prescriptions filled.  Explained to patient that it is concerning that her prescriptions are not showing up on the [employer].  This needs to be further investigated, unfortunately due to time constraints I unable to look into further today.  Provided patient with 1 month refill of her Klonopin (#30).  Discussed with patient that benzodiazepines are not ideal for management of chronic anxiety and is unsafe when co prescribing suboxone.  We will continue for now but patient is aware that this may be changed at future visits.  BuSpar may be a safer option or trazodone since she mostly uses this medication for sleep. -Refilled Klonopin 1 mg nightly (#30) no refills --Follow up PCP

## 2018-08-11 NOTE — Telephone Encounter (Signed)
Elizabeth Clarke called this afternoon at 7:30pm stating that the pharmacy could not refill her medication prescribed today because the provider was not registered with Medicaid.  I called the pharmacy and they stated that Dr. Antony Contras was not registered.  They were able to switch Elizabeth Clarke's prescriptions of prozac, klonopin, keppra, topamax and gabapentin to my name.  I did a verbal order for one month supply of each with no refills.

## 2018-08-11 NOTE — Assessment & Plan Note (Addendum)
Patient is complaining of dysuria.  Point-of-care urinalysis was significant for proteinuria, urobilinogen, trace hematuria, and trace leukocytes.  Per lab, sample was small and very concentrated.  A second sample was collected and sent for complete urinalysis as well as urine culture.  Of note, on speculum exam patient had copious vaginal discharge suspicious for an STI.  Suspect symptoms are secondary to that and less likely UTI. -Follow-up urinalysis and urine culture -Follow-up wet prep and GC chlamydia  ADDENDUM: Urine culture with no growth (final). Suspect symptoms are secondary to vaginal candidiasis, BV, and trichomonas infection. Prescriptions for fluconazole and metronidazole were sent to her pharmacy.

## 2018-08-11 NOTE — Assessment & Plan Note (Addendum)
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  Refilled gabapentin 600 mg twice daily.

## 2018-08-11 NOTE — Assessment & Plan Note (Addendum)
Patient reports one episode of hematochezia a couple of days ago associated with pain with defecation.  External examination of the rectum appeared normal, no fissures or external hemorrhoids.  Digital rectal exam deferred.  Unclear reason for hematochezia.  Will follow-up CBC.  ADDENDUM: CBC with normal hemoglobin. Will continue to monitor.

## 2018-08-11 NOTE — Patient Instructions (Signed)
Ms. Inzer - -  I am sorry you have been having such a hard time!   Please continue your buprenorphine twice a day to help with your withdrawal.  Please come back to see [place] in 1 week, sooner if you need!  We will make you an appointment in our urgent care clinic this afternoon for your other medical issues.    Thank you!

## 2018-08-11 NOTE — Patient Instructions (Signed)
Elizabeth Clarke,  It was a pleasure to meet you. I will call you when I get the results of your testing.   I have ordered an ultrasound of your heart.  You will be called to schedule this. Follow up with [place] again in 1 month or sooner if needed. If you have any questions or concerns, call our clinic at [phone] or after hours call [phone] and ask for the internal medicine resident on call. Thank you!  Dr. Antony Contras

## 2018-08-11 NOTE — Assessment & Plan Note (Signed)
Somewhat controlled, she missed her last appointment.  She is very tearful today, but has strong desire to get clean.   Plan Refill BUprenorphine 8-2mg  BID U tox today PDMP reviewed and appropriate Follow up in this clinic in 1 week for further evaluation

## 2018-08-11 NOTE — Assessment & Plan Note (Signed)
Patient has not heard back from her neurologist for refills of her Keppra and Topamax.  Provided 1 month refills, instructed her to follow-up with neurology.

## 2018-08-11 NOTE — Assessment & Plan Note (Signed)
Would check CMET and INR given bleeding and swelling.  Would stop mavyret if abnormal.  She has an appointment in clinic this afternoon to address these acute medical issues as well, will go ahead and place orders for CMET and INR, possibly she can get them checked prior to her appointment.
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
## 2018-08-11 NOTE — Progress Notes (Signed)
   CC: LE swelling   HPI:  [name] is a 33 y.o. female with past medical history outlined below here for LE swelling and to establish care. For the details of today's visit, please refer to the assessment and plan.  Past Medical History:  Diagnosis Date  . Chronic viral hepatitis C (HCC) 07/10/2018  . Diabetes mellitus without complication (HCC)   . Seizures (HCC)     Family History: Mom deceased 9 heart disease and breast cancer. Maternal grandmother and aunt with colon cancer and breast cancer. Dad with HTN and heart disease.   Social History: Opioid use disorder on suboxone. Occasionally uses meth. Never smoker. Denies alcohol.   Review of Systems  Constitutional: Negative for chills and fever.  Respiratory: Negative for cough and shortness of breath.   Cardiovascular: Negative for chest pain.  Gastrointestinal: Positive for blood in stool. Negative for abdominal pain, constipation and nausea.  Genitourinary: Positive for dysuria.       Vaginal discharge & bleeding  Skin: Negative for itching and rash.    Physical Exam:  Vitals:   08/11/18 1353  BP: 127/79  Pulse: 83  SpO2: 100%  Weight: 194 lb (88 kg)  Height: 5\' 9"  (1.753 m)    Constitutional: NAD, appears comfortable Cardiovascular: RRR, no m/r/g Pulmonary/Chest: CTAB, no wheezes, rales, or rhonchi.  Abdominal: Soft, non tender, non distended. +BS.  GU: Normal external genitalia. Normal external rectal exam. Speculum exam performed with copious amounts of white discharge, very tender during examination. s/p hysterectomy, unable to visualize a cervix however exam limited due to excessive discharge. Extensive vaginal mucosal changes consistent with condyloma acuminatum, few petechial hemorrhages. No active bleeding.  Extremities: Warm and well perfused. Lower extremities with bilateral non pitting edema, few superficial blisters.  Psychiatric: Normal mood and affect  Assessment & Plan:   See  Encounters Tab for problem based charting.  Patient discussed with Dr. Cleda Daub
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
## 2018-08-12 LAB — URINALYSIS, COMPLETE
Bilirubin, UA: NEGATIVE
Glucose, UA: NEGATIVE
Ketones, UA: NEGATIVE
Nitrite, UA: NEGATIVE
RBC, UA: NEGATIVE
Specific Gravity, UA: >=1.03 — AB (ref 1.005–1.030)
Urobilinogen, Ur: 1 mg/dL (ref 0.2–1.0)
pH, UA: 6 (ref 5.0–7.5)

## 2018-08-12 LAB — MICROSCOPIC EXAMINATION
CASTS: NONE SEEN /LPF
Epithelial Cells (non renal): >10 /hpf — AB (ref 0–10)

## 2018-08-12 LAB — CERVICOVAGINAL ANCILLARY ONLY
Bacterial vaginitis: POSITIVE — AB
Candida vaginitis: POSITIVE — AB
Chlamydia: NEGATIVE
Neisseria Gonorrhea: NEGATIVE
Trichomonas: POSITIVE — AB

## 2018-08-12 LAB — HIV ANTIBODY (ROUTINE TESTING W REFLEX): HIV Screen 4th Generation wRfx: NONREACTIVE

## 2018-08-13 LAB — URINE CULTURE: Organism ID, Bacteria: NO GROWTH

## 2018-08-13 MED ORDER — FLUCONAZOLE 150 MG PO TABS: 150.0000 mg | ORAL_TABLET | Freq: Once | ORAL | 0 refills | Status: AC

## 2018-08-13 MED ORDER — METRONIDAZOLE 500 MG PO TABS: 500.0000 mg | ORAL_TABLET | Freq: Two times a day (BID) | ORAL | 0 refills | Status: DC

## 2018-08-13 NOTE — Progress Notes (Signed)
Internal Medicine Clinic Attending  Case discussed with Dr. Guilloud at the time of the visit.  We reviewed the resident's history and exam and pertinent patient test results.  I agree with the assessment, diagnosis, and plan of care documented in the resident's note.  

## 2018-08-13 NOTE — Addendum Note (Signed)
Addended by: Burnell Blanks on: 08/13/2018 09:44 AM   Modules accepted: Orders

## 2018-08-14 ENCOUNTER — Telehealth: Payer: Self-pay | Admitting: *Deleted

## 2018-08-14 DIAGNOSIS — N760 Acute vaginitis: Secondary | ICD-10-CM

## 2018-08-14 DIAGNOSIS — B9689 Other specified bacterial agents as the cause of diseases classified elsewhere: Secondary | ICD-10-CM

## 2018-08-14 DIAGNOSIS — A599 Trichomoniasis, unspecified: Secondary | ICD-10-CM

## 2018-08-14 LAB — TOXASSURE SELECT,+ANTIDEPR,UR

## 2018-08-14 NOTE — Telephone Encounter (Signed)
Received fax from [facility] that fluconazole and metronidazole sent 08/13/2018 need to be resent by prescriber who is registered with medicaid. Will route to Attending Pool. Kinnie Feil, RN, BSN

## 2018-08-17 MED ORDER — METRONIDAZOLE 500 MG PO TABS: 500.0000 mg | ORAL_TABLET | Freq: Two times a day (BID) | ORAL | 0 refills | Status: DC

## 2018-08-17 MED ORDER — FLUCONAZOLE 150 MG PO TABS: 150.0000 mg | ORAL_TABLET | Freq: Once | ORAL | 0 refills | Status: AC

## 2018-08-17 NOTE — Telephone Encounter (Signed)
Resent

## 2018-08-18 ENCOUNTER — Ambulatory Visit (INDEPENDENT_AMBULATORY_CARE_PROVIDER_SITE_OTHER): Payer: Medicaid Other | Admitting: Internal Medicine

## 2018-08-18 VITALS — BP 127/70 | HR 110 | Temp 98.6°F | Wt 203.6 lb

## 2018-08-18 DIAGNOSIS — F1120 Opioid dependence, uncomplicated: Secondary | ICD-10-CM | POA: Diagnosis present

## 2018-08-18 DIAGNOSIS — R600 Localized edema: Secondary | ICD-10-CM

## 2018-08-18 MED ORDER — BUPRENORPHINE HCL-NALOXONE HCL 8-2 MG SL FILM: 1.0000 | ORAL_FILM | Freq: Two times a day (BID) | SUBLINGUAL | 0 refills | Status: DC

## 2018-08-18 MED ORDER — FUROSEMIDE 20 MG PO TABS: 20.0000 mg | ORAL_TABLET | Freq: Every day | ORAL | 0 refills | Status: DC

## 2018-08-18 NOTE — Progress Notes (Signed)
Internal Medicine Clinic Attending  I saw and evaluated the patient.  I personally confirmed the key portions of the history and exam documented by Dr. Harbrecht and I reviewed pertinent patient test results.  The assessment, diagnosis, and plan were formulated together and I agree with the documentation in the resident's note.  

## 2018-08-18 NOTE — Assessment & Plan Note (Signed)
Stable. Denied withdraw. Continues to desire to stay clean. Appears appropriate for continues therapy.   Plan: Continue Suboxone 16mg  daily Return in one week Consider urine tox at that appointment

## 2018-08-18 NOTE — Assessment & Plan Note (Signed)
Lower Extremity Edema: No certain indication at this time. Chronic 3 month history now. Painful to touch. Grossly edematous. UA, CMP, CBC, unremarkable at most recent visit on 08/11/2018. Vas [place] for DVT negative on 07/06/2018. Echocardiogram ordered and scheduled for 08/19/2018 as per patient.  Plan: TSH, ESR, CRP ordered in consideration of less likely autoimmune or thyroid disorders.  7 day course of furosemide, patient advised to use one tablet the initial day and monitor for lightheadedness. Would recommend a BMP at her next visit Advised purchasing mid grade compression stockings to above her knees and consoled on use of such

## 2018-08-18 NOTE — Progress Notes (Signed)
   08/18/2018  Elizabeth Clarke presents for follow up of opioid use disorder I have reviewed the prior induction visit, follow up visits, and telephone encounters relevant to opiate use disorder (OUD) treatment.   Current daily dose: 8-2mg  BID  Date of Induction: 07/28/2018, missed 1 week follow-up due to viral illness, returned for third week  Current follow up interval, in weeks: One week  The patient has been adherent with the buprenorphine for OUD contract.   Last UDS Result: Methamphetamine and suboxone expected, fluoxetine unexpected  HPI: Elizabeth Clarke is a 33 yo Female who presents today for evaluation and treatment of her OUD. She has struggled with heroin addiction for some time but continues to endorse symptom control on suboxone. Denied withdraw symptoms and feels better today than previously stated. She endorses notable concern for her bilateral extremity edema today (see A/P for additional details).  Exam:   Vitals:   08/18/18 1118  BP: 127/70  Pulse: (!) 110  Temp: 98.6 F (37 C)  TempSrc: Oral  SpO2: 99%  Weight: 203 lb 9.6 oz (92.4 kg)   General: A/O x4, in no acute distress, afebrile, nondiaphoretic Cardio: RRR, no mrg's Pulmonary: CTA bilaterally MSK: BLE tender to palpation, mildly erythematous from the feet to below the knees bilaterally, 3+ pitting edema bilaterally Psych: Appropriate affect, not depressed in appearance, engages well  Assessment/Plan:  See Problem Based Charting in the Encounters Tab  Elizabeth Bal, MD  08/18/2018  11:52 AM

## 2018-08-18 NOTE — Patient Instructions (Addendum)
One week follow-up.  Please stop and schedule with your PCP for your swollen legs.

## 2018-08-19 ENCOUNTER — Telehealth: Payer: Self-pay

## 2018-08-19 ENCOUNTER — Ambulatory Visit (HOSPITAL_COMMUNITY): Admission: RE | Admit: 2018-08-19 | Payer: Medicaid Other | Source: Ambulatory Visit

## 2018-08-19 LAB — SEDIMENTATION RATE: SED RATE: 48 mm/h — AB (ref 0–32)

## 2018-08-19 LAB — C-REACTIVE PROTEIN: CRP: <1 mg/L (ref 0–10)

## 2018-08-19 LAB — TSH: TSH: 2.7 u[IU]/mL (ref 0.450–4.500)

## 2018-08-19 NOTE — Telephone Encounter (Addendum)
Spoke to  [employer] with the pre-services center to notified him about the pre-authorization for the Echocardiogram Complete. Per Sabino Snipes with [employer] the CPT [phone], cannot be authorize under the [facility] plan.

## 2018-08-19 NOTE — Telephone Encounter (Signed)
Pt seen in OUD yesterday, went to pharmacy to pick up Buprenorphine HCl-Naloxone HCl 8-2 MG FILM, but pharmacy was out. Pt informed that it will be ready in 2-3 days.  Pt has medicaid restrictions on which pharmacy she uses, therefore she is unable to pick up from a different pharmacy.  Pt wanted to make [facility] aware.Criss Alvine, Reegan Bouffard Cassady2/26/202010:48 AM

## 2018-08-19 NOTE — Telephone Encounter (Signed)
Needs to speak with a nurse about   Buprenorphine HCl-Naloxone HCl 8-2 MG FILM   Please call pt back.

## 2018-08-20 ENCOUNTER — Other Ambulatory Visit: Payer: Self-pay

## 2018-08-20 NOTE — Telephone Encounter (Signed)
Please contact pt [phone]; pt is having trouble getting medicine

## 2018-08-20 NOTE — Telephone Encounter (Signed)
Pt stated Suboxone needs a PA. I will send to [facility]. Also stated she waited for 2 hrs to have echo done to be told it was not authorized thru Medicaid. I asked Hme to f/u.

## 2018-08-24 ENCOUNTER — Telehealth (HOSPITAL_COMMUNITY): Payer: Self-pay | Admitting: Psychology

## 2018-08-25 ENCOUNTER — Other Ambulatory Visit: Payer: Self-pay | Admitting: *Deleted

## 2018-08-25 NOTE — Telephone Encounter (Signed)
Pt states she was just disch from [facility], states she was having seizures and was taken by EMS to hosp several days ago. Request for refill of suboxone sent also. appt tues 3/10 in OUD

## 2018-08-26 MED FILL — MAVYRET 100-40 MG TABS: 100-40 | 28 days supply | Qty: 84 | Fill #1

## 2018-08-26 NOTE — Telephone Encounter (Signed)
What day was she discharged from [facility], it appears she filled her last Rx on 2/27, and was in [place] ED on 2/29, but I cannot see when she was discharged, did she miss her appointment because she was admitted?

## 2018-08-27 ENCOUNTER — Encounter: Payer: Self-pay | Admitting: Pharmacy Technician

## 2018-08-27 ENCOUNTER — Other Ambulatory Visit: Payer: Self-pay | Admitting: Internal Medicine

## 2018-08-28 MED ORDER — BUPRENORPHINE HCL-NALOXONE HCL 8-2 MG SL FILM: 1.0000 | ORAL_FILM | Freq: Two times a day (BID) | SUBLINGUAL | 0 refills | Status: DC

## 2018-08-28 NOTE — Telephone Encounter (Signed)
Please schedule patient for appointment on 3/17 (next available).  Absolutely needs to attend.

## 2018-09-01 ENCOUNTER — Telehealth: Payer: Self-pay | Admitting: Pharmacist

## 2018-09-01 ENCOUNTER — Ambulatory Visit: Payer: Medicaid Other | Admitting: Pharmacist

## 2018-09-01 ENCOUNTER — Telehealth: Payer: Self-pay

## 2018-09-01 NOTE — Telephone Encounter (Signed)
Received a call today from patient's friend who would like to speak with someone from our office regarding patient's Mavyret. Patient's friend states that patient has had increase seizures since starting medication, and was recently admitted to a hospital. Patient's friend would like to know if the Mavyret has any contraindication with seizure medication. Patient also is complaining of increase swelling on ankles. Called was transferred to [facility], Pharmacist. Lorenso Courier, CMA

## 2018-09-01 NOTE — Telephone Encounter (Signed)
Elizabeth Clarke - any concerns you would have about Mavyret and seizures or changes to seizure medications?

## 2018-09-01 NOTE — Telephone Encounter (Signed)
Thank you for the update. I appreciate you very much. [place] has had a hard time for sure with multiple issues. Hopefully she will feel better and get through her last few weeks.

## 2018-09-01 NOTE — Telephone Encounter (Signed)
Patient's friend called (with patient in background, birthday verified) stating that patient just got out of the hospital for seizures. She has had issues with seizure since she was a baby.  She wanted to know if her Mavyret had interactions with her seizures medications, Keppra and Topamax. I verified that there were no drug-drug interactions between the medications.  She was supposed to see me today but rescheduled for 3/23 as she is very weak and unable to come in today. I told her to power through her last 3-4 weeks of Mavyret to complete treatment. Patient and patient's friend verbalized understanding and she will continue medications.

## 2018-09-07 ENCOUNTER — Telehealth: Payer: Self-pay | Admitting: Internal Medicine

## 2018-09-07 DIAGNOSIS — F32A Depression, unspecified: Secondary | ICD-10-CM

## 2018-09-07 DIAGNOSIS — R600 Localized edema: Secondary | ICD-10-CM

## 2018-09-07 DIAGNOSIS — G40909 Epilepsy, unspecified, not intractable, without status epilepticus: Secondary | ICD-10-CM

## 2018-09-07 DIAGNOSIS — M79672 Pain in left foot: Secondary | ICD-10-CM

## 2018-09-07 DIAGNOSIS — M79671 Pain in right foot: Secondary | ICD-10-CM

## 2018-09-07 DIAGNOSIS — F329 Major depressive disorder, single episode, unspecified: Secondary | ICD-10-CM

## 2018-09-07 DIAGNOSIS — F419 Anxiety disorder, unspecified: Secondary | ICD-10-CM

## 2018-09-07 MED ORDER — GABAPENTIN 600 MG PO TABS: 600.0000 mg | ORAL_TABLET | Freq: Two times a day (BID) | ORAL | 5 refills | Status: DC

## 2018-09-07 MED ORDER — LEVETIRACETAM 750 MG PO TABS: 750.0000 mg | ORAL_TABLET | Freq: Two times a day (BID) | ORAL | 1 refills | Status: DC

## 2018-09-07 MED ORDER — FUROSEMIDE 20 MG PO TABS: 20.0000 mg | ORAL_TABLET | Freq: Every day | ORAL | 0 refills | Status: DC

## 2018-09-07 MED ORDER — FLUOXETINE HCL 40 MG PO CAPS: 40.0000 mg | ORAL_CAPSULE | Freq: Every day | ORAL | 5 refills | Status: DC

## 2018-09-07 MED ORDER — BUPRENORPHINE HCL-NALOXONE HCL 8-2 MG SL FILM: 1.0000 | ORAL_FILM | Freq: Two times a day (BID) | SUBLINGUAL | 0 refills | Status: DC

## 2018-09-07 MED ORDER — TOPIRAMATE 100 MG PO TABS: 100.0000 mg | ORAL_TABLET | Freq: Two times a day (BID) | ORAL | 1 refills | Status: DC

## 2018-09-07 MED ORDER — CLONAZEPAM 1 MG PO TABS: 1.0000 mg | ORAL_TABLET | Freq: Every day | ORAL | 0 refills | Status: DC

## 2018-09-07 NOTE — Telephone Encounter (Signed)
Spoke to Elizabeth Clarke, she did not know that I sent in refill of suboxone on 3/6 so she has been off therapy.  Relapsed 2 days ago with heroine, wants to get back on suboxone.  Otherwise feeling well no issues. Reports lasix fluid pill helped greatly with swelling.   Will cancel her appointment for OUD tomorrow given COIVD concerns, she will call with any issues. Will given 1 month Rx for suboxone. She is to call 1 week prior to running out.  Provided limited Rx of klonopin, and refills of seziure meds.  Please call and cancel Suboxone Rx from 3/6 (22 films)

## 2018-09-07 NOTE — Telephone Encounter (Signed)
OUD appt for tomorrow cancelled. Call placed to [facility], however, they are currently closed and will reopen at 2 PM. Will make second attempt after 2. L. Grayden Burley, RN, BSN

## 2018-09-07 NOTE — Telephone Encounter (Signed)
Spoke with Katrina at [facility]. She will fill today's 30 day supply of suboxone and cancel 11 day supply sent 09/02/2018. Kinnie Feil, RN, BSN

## 2018-09-14 ENCOUNTER — Ambulatory Visit (INDEPENDENT_AMBULATORY_CARE_PROVIDER_SITE_OTHER): Payer: Medicaid Other | Admitting: Pharmacist

## 2018-09-14 ENCOUNTER — Other Ambulatory Visit: Payer: Self-pay

## 2018-09-14 DIAGNOSIS — B182 Chronic viral hepatitis C: Secondary | ICD-10-CM

## 2018-09-14 NOTE — Progress Notes (Signed)
HPI: Elizabeth Clarke is a 33 y.o. female who presents to the [facility] pharmacy clinic for Hepatitis C follow-up.  Medication: Mavyret x 8 weeks  Start Date: 08/26/2018  Hepatitis C Genotype: 3  Fibrosis Score: F0  Hepatitis C RNA: 4.5 million on 1/172020  Patient Active Problem List   Diagnosis Date Noted  . Lower extremity edema 08/11/2018  . Dysuria 08/11/2018  . Condyloma acuminatum of vagina 08/11/2018  . Vaginal discharge 08/11/2018  . Hematochezia 08/11/2018  . Depression 08/11/2018  . Anxiety 08/11/2018  . Chronic viral hepatitis C (HCC) 07/10/2018  . Bilateral foot pain 07/10/2018  . Seizure disorder (HCC) 10/19/2015  . Opioid use disorder, severe, dependence (HCC) 10/11/2014    Patient's Medications  New Prescriptions   No medications on file  Previous Medications   BUPRENORPHINE HCL-NALOXONE HCL 8-2 MG FILM    Place 1 Film under the tongue 2 (two) times daily.   CLONAZEPAM (KLONOPIN) 1 MG TABLET    Take 1 tablet (1 mg total) by mouth at bedtime.   FLUOXETINE (PROZAC) 40 MG CAPSULE    Take 1 capsule (40 mg total) by mouth daily.   FUROSEMIDE (LASIX) 20 MG TABLET    Take 1 tablet (20 mg total) by mouth daily.   GABAPENTIN (NEURONTIN) 600 MG TABLET    Take 1 tablet (600 mg total) by mouth 2 (two) times daily.   GLECAPREVIR-PIBRENTASVIR (MAVYRET) 100-40 MG TABS    Take 3 tablets by mouth daily with breakfast.   IRON PO    Take 1 tablet by mouth as needed (low Iron).    LEVETIRACETAM (KEPPRA) 750 MG TABLET    Take 1 tablet (750 mg total) by mouth 2 (two) times daily.   METRONIDAZOLE (FLAGYL) 500 MG TABLET    Take 1 tablet (500 mg total) by mouth 2 (two) times daily.   TOPIRAMATE (TOPAMAX) 100 MG TABLET    Take 1 tablet (100 mg total) by mouth 2 (two) times daily.  Modified Medications   No medications on file  Discontinued Medications   No medications on file    Allergies: Allergies  Allergen Reactions  . Aspartame Anaphylaxis and Hives  . Codeine Hives and  Swelling    Throat swelling. Throat swells up Throat swells up   . Fish Allergy Anaphylaxis  . Sulfamethoxazole-Trimethoprim Rash  . Latex Rash    Past Medical History: Past Medical History:  Diagnosis Date  . Chronic viral hepatitis C (HCC) 07/10/2018  . Diabetes mellitus without complication (HCC)   . Seizures (HCC)     Social History: Social History   Socioeconomic History  . Marital status: Single    Spouse name: Not on file  . Number of children: Not on file  . Years of education: Not on file  . Highest education level: Not on file  Occupational History  . Not on file  Social Needs  . Financial resource strain: Not on file  . Food insecurity:    Worry: Not on file    Inability: Not on file  . Transportation needs:    Medical: Not on file    Non-medical: Not on file  Tobacco Use  . Smoking status: Never Smoker  . Smokeless tobacco: Never Used  Substance and Sexual Activity  . Alcohol use: No  . Drug use: Yes    Types: Heroin, Other-see comments    Comment: injects "roxys"  . Sexual activity: Yes  Lifestyle  . Physical activity:    Days per week:  Not on file    Minutes per session: Not on file  . Stress: Not on file  Relationships  . Social connections:    Talks on phone: Not on file    Gets together: Not on file    Attends religious service: Not on file    Active member of club or organization: Not on file    Attends meetings of clubs or organizations: Not on file    Relationship status: Not on file  Other Topics Concern  . Not on file  Social History Narrative  . Not on file    Labs: Hepatitis C Lab Results  Component Value Date   HCVGENOTYPE 3 07/10/2018   HCVRNAPCRQN 4,550,000 (H) 07/10/2018   FIBROSTAGE F0 07/10/2018   Hepatitis B Lab Results  Component Value Date   HEPBSAB NON-REACTIVE 07/10/2018   HEPBSAG NON-REACTIVE 07/10/2018   HEPBCAB NON-REACTIVE 07/10/2018   Hepatitis A Lab Results  Component Value Date   HAV REACTIVE  (A) 07/10/2018   HIV Lab Results  Component Value Date   HIV Non Reactive 08/11/2018   HIV NON-REACTIVE 07/10/2018   Lab Results  Component Value Date   CREATININE 0.66 08/11/2018   CREATININE 0.65 07/06/2018   CREATININE 0.53 10/11/2014   CREATININE 0.50 10/10/2014   Lab Results  Component Value Date   AST 28 08/11/2018   AST 55 (H) 07/06/2018   AST 23 10/10/2014   ALT 28 08/11/2018   ALT 47 (H) 07/10/2018   ALT 50 (H) 07/06/2018   INR 0.99 08/11/2018   INR 0.9 07/10/2018    Assessment: Elizabeth Clarke is here today to follow-up for her chronic Hepatitis C infection.  She started 8 weeks of Mavyret a little over a month ago.  She is having some issues with adherence and side effects that she is attributing to Mavyret.  She went to the hospital with seizures a few weeks ago and had a stay there for 4 days.  She states that they did not give her any Mavyret while she was there.  I explained to her that usually that needed to be brought in from home but she did not do that either.  She also states she missed 2-3 days when she wouldn't answer the pharmacy's call for her refill. She states she had a relapse with IVDU and was out of her mind for awhile.  She was also talking about the police and "SWAT team" coming to her house last week.  I told her that it wasn't good that she had missed that many doses and that we would just hope for the best.  She also seems to think that Mavyret is making her swell.  I told her that usually isn't a side effect from the medication and that Elizabeth Clarke noted in her office visit that she had issues with swelling long before she started the Mavyret.  She also states that a doctor told her that her fingernails and toenails would turn yellow with Mavyret and she is complaining that they did for a few days but it has since resolved.   I spent some time explaining that all of these issues she is having is likely not due to Mavyret. I encouraged her to not miss anymore  doses and that her chance of cure goes down with each dose she misses. She verbalized understanding and stated she will do her best.  Will check labs today and have her come back and see Elizabeth Clarke when she has completed treatment.  Plan: - Continue Mavyret x 8 weeks - Hep C RNA + CMET today - F/u with Elizabeth Clarke 4/28 at 930am  Cassie L. Kuppelweiser, PharmD, BCIDP, AAHIVP, CPP Infectious Diseases Clinical Pharmacist [facility] 09/14/2018, 2:29 PM

## 2018-09-25 LAB — COMPREHENSIVE METABOLIC PANEL
AG Ratio: 1.3 (calc) (ref 1.0–2.5)
ALT: 15 U/L (ref 6–29)
AST: 25 U/L (ref 10–30)
Albumin: 4.6 g/dL (ref 3.6–5.1)
Alkaline phosphatase (APISO): 61 U/L (ref 31–125)
BUN: 18 mg/dL (ref 7–25)
CO2: 26 mmol/L (ref 20–32)
Calcium: 9.8 mg/dL (ref 8.6–10.2)
Chloride: 102 mmol/L (ref 98–110)
Creat: 0.68 mg/dL (ref 0.50–1.10)
GLOBULIN: 3.5 g/dL (ref 1.9–3.7)
Glucose, Bld: 86 mg/dL (ref 65–99)
Potassium: 3.9 mmol/L (ref 3.5–5.3)
SODIUM: 137 mmol/L (ref 135–146)
Total Bilirubin: 0.5 mg/dL (ref 0.2–1.2)
Total Protein: 8.1 g/dL (ref 6.1–8.1)

## 2018-09-25 LAB — HEPATITIS C RNA QUANTITATIVE
HCV Quantitative Log: <1.18 Log IU/mL
HCV RNA, PCR, QN: <15 IU/mL

## 2018-10-07 ENCOUNTER — Other Ambulatory Visit: Payer: Self-pay | Admitting: *Deleted

## 2018-10-07 DIAGNOSIS — F419 Anxiety disorder, unspecified: Secondary | ICD-10-CM

## 2018-10-07 DIAGNOSIS — R600 Localized edema: Secondary | ICD-10-CM

## 2018-10-08 ENCOUNTER — Telehealth: Payer: Self-pay | Admitting: *Deleted

## 2018-10-08 MED ORDER — CLONAZEPAM 1 MG PO TABS: 1.0000 mg | ORAL_TABLET | Freq: Every day | ORAL | 0 refills | Status: DC

## 2018-10-08 MED ORDER — FUROSEMIDE 20 MG PO TABS: 20.0000 mg | ORAL_TABLET | Freq: Every day | ORAL | 0 refills | Status: AC

## 2018-10-08 MED ORDER — BUPRENORPHINE HCL-NALOXONE HCL 8-2 MG SL FILM: 1.0000 | ORAL_FILM | Freq: Two times a day (BID) | SUBLINGUAL | 0 refills | Status: DC

## 2018-10-08 NOTE — Telephone Encounter (Signed)
Called and spoke with patient.  Confirmed identity with name and phone number.   Patient reports that she is not doing well today.   She found her fiancee not breathing yesterday evening, and apparently he had been gone for > 1 hour.  She was tearful on the phone.  She noted that she doesn't have any family to support her right now and she is not sure what to do.  I advised her that we could have our behavioral specialist call her and I spoke with one of our nurses to call her as well if Phineas Semen could not reach her.   She requested refills on Buprenorphine, clonazepam and lasix which I will send in today.   She needs meds called in.  [facility] would not let me send them electronically.   Debe Coder, MD

## 2018-10-08 NOTE — Telephone Encounter (Signed)
Requesting to speak with a nurse about meds and referral. Please call back.

## 2018-10-08 NOTE — Telephone Encounter (Signed)
I have tried to call also no answer

## 2018-10-08 NOTE — Telephone Encounter (Signed)
Pt would like a call back

## 2018-10-09 NOTE — Telephone Encounter (Signed)
These 3 meds were called to [place] at [facility]. Kinnie Feil, RN, BSN

## 2018-10-09 NOTE — Telephone Encounter (Signed)
Pharmacy is currently closed and will reopen at 1400. Will call back later. Kinnie Feil, RN, BSN

## 2018-10-15 ENCOUNTER — Telehealth: Payer: Self-pay | Admitting: *Deleted

## 2018-10-15 DIAGNOSIS — F419 Anxiety disorder, unspecified: Secondary | ICD-10-CM

## 2018-10-15 DIAGNOSIS — F1120 Opioid dependence, uncomplicated: Secondary | ICD-10-CM

## 2018-10-15 DIAGNOSIS — F332 Major depressive disorder, recurrent severe without psychotic features: Secondary | ICD-10-CM

## 2018-10-15 NOTE — Telephone Encounter (Signed)
I finally was able to speak w/ pt, she states she is having trouble sleeping and it is hard dealing w/ everything, she states she is working hard to cope. Sig other passed away in the home she lives in and it makes it worse. She is having a horrible time trying to sleep, has been doubling up on her klonipin when she is exhausted to be able to sleep 2-4 hours. Wants dr Criselda Peaches to suggest something. Would like ashton to call her asap, states she tried to call myself and ashton back several times but no one answers the ph in clinic lots of times Will send to ashton and dr Criselda Peaches

## 2018-10-15 NOTE — Telephone Encounter (Signed)
Have tried to call pt at all #'s closing

## 2018-10-16 NOTE — Telephone Encounter (Signed)
Please forward to Dr. Mikey Bussing.  He is taking care of the buprenorphine patients this week.  Please also schedule with Phineas Semen.   Thanks!

## 2018-10-16 NOTE — Telephone Encounter (Signed)
Called but no answer, from chart review it appears she should have buprenorphine Rx through 5/16.  Appears to be a psych issue, I believe she was encouraged to establish with primary care which would be beneficial for this increased anxiety/ adjustment, I would not be comfortable in increasing klonopin any further, she may need referral to psychiatry.

## 2018-10-20 ENCOUNTER — Ambulatory Visit (INDEPENDENT_AMBULATORY_CARE_PROVIDER_SITE_OTHER): Payer: Medicaid Other | Admitting: Infectious Diseases

## 2018-10-20 ENCOUNTER — Other Ambulatory Visit: Payer: Self-pay

## 2018-10-20 ENCOUNTER — Other Ambulatory Visit: Payer: Medicaid Other

## 2018-10-20 ENCOUNTER — Encounter: Payer: Self-pay | Admitting: Infectious Diseases

## 2018-10-20 DIAGNOSIS — B182 Chronic viral hepatitis C: Secondary | ICD-10-CM | POA: Diagnosis not present

## 2018-10-20 DIAGNOSIS — F332 Major depressive disorder, recurrent severe without psychotic features: Secondary | ICD-10-CM | POA: Diagnosis not present

## 2018-10-20 NOTE — Progress Notes (Signed)
Name: Elizabeth Clarke  XKP:[id number]   DOB: Oct 28, 1985   PCP: System, Pcp Not In   Virtual Visit via Telephone Note  I connected with Elizabeth Clarke on 10/20/18 at  9:30 AM EDT by telephone and verified that I am speaking with the correct person using two identifiers.   I discussed the limitations, risks, security and privacy concerns of performing an evaluation and management service by telephone and the availability of in person appointments. I also discussed with the patient that there may be a patient responsible charge related to this service. The patient expressed understanding and agreed to proceed.   Chief Complaint  Patient presents with  . Follow-up    Hep C     History of Present Illness: Elizabeth Clarke is a 33 y.o. female with chronic Hepatitis C follow up.   Medication: Mavyret x 8 weeks  Start Date: 08/26/2018   End Date: April 9th - estimates 1 week lapse during tx  Hepatitis C Genotype: 3  Fibrosis Score: F0  Hepatitis C RNA: 4.5 million on 1/172020  She missed about 7 days during hospitalization when she had a seizure - she was not able to bring her medication with her as she was "pretty out of it." Estimates that her last dose of Mavyret to have been on April 9th.  She experienced a little bit of fatigue and mild headache during the first 2 weeks however these seem to resolve after she got used to it.  Unfortunately Elizabeth Clarke lost her fianc recently.  She found him passed away in their bed.  Likely cause of death was listed to be a heart attack.  She is understandably very upset and is dealing with everything.  She was in the past working with our counselor Rene Kocher and found her to be very helpful and would like for her to reach out again.  She is still working with the Suboxone clinic to assist with her opioid use disorder. ER visit noted on 4/07 for cough/viral illness that was con  Medical/surgical/social/family history have been updated during  today's visit.    Observations/Objective: Elizabeth Clarke sounds a little down on the phone but not in distress.   Lab Results  Component Value Date   ALT 15 09/14/2018   AST 25 09/14/2018   ALKPHOS 59 08/11/2018   BILITOT 0.5 09/14/2018   Lab Results  Component Value Date   HCVGENOTYPE 3 07/10/2018   Hepatitis C RNA quantitative Latest Ref Rng & Units 09/14/2018 07/10/2018  HCV Quantitative Log NOT DETECT Log IU/mL <1.18 NOT DETECTED 6.66(H)     Assessment and Plan: Problem List Items Addressed This Visit      Unprioritized   Chronic viral hepatitis C (HCC) - Primary (Chronic)    She completed 8 weeks of Mavyret treatment and unfortunately had a 7-day lapse during hospital stay for seizure activity.  Her 4-week response yielded a an undetectable hep C RNA .  Instead of waiting for the SVR check I will have her come in to obtain an ETR hep C RNA this week so if we need to consider another regimen for her we can do so sooner rather than later.  If she is undetectable again we will have her come back in 12 weeks to repeat her blood work for SVR and see me in person.      Relevant Orders   Hepatitis C RNA quantitative   Hepatic function panel   Depression    Acutely worsened in  the setting of loss of her fiance. She has already dealt with a lot of loss in her life and struggles with addiction. [facility] to see if she can help to support her.            Follow Up Instructions: She will come in for ETR labs today.    I discussed the assessment and treatment plan with the patient. The patient was provided an opportunity to ask questions and all were answered. The patient agreed with the plan and demonstrated an understanding of the instructions.   The patient was advised to call back or seek an in-person evaluation if the symptoms worsen or if the condition fails to improve as anticipated.  I provided 12 minutes of non-face-to-face time during this encounter.    Rexene AlbertsStephanie Dixon, MSN, NP-C [facility]  [email or web address] Pager: [phone] Office: [phone] [facility] Main Line: [phone]

## 2018-10-20 NOTE — Assessment & Plan Note (Signed)
She completed 8 weeks of Mavyret treatment and unfortunately had a 7-day lapse during hospital stay for seizure activity.  Her 4-week response yielded a an undetectable hep C RNA .  Instead of waiting for the SVR check I will have her come in to obtain an ETR hep C RNA this week so if we need to consider another regimen for her we can do so sooner rather than later.  If she is undetectable again we will have her come back in 12 weeks to repeat her blood work for SVR and see me in person.

## 2018-10-20 NOTE — Assessment & Plan Note (Signed)
Acutely worsened in the setting of loss of her fiance. She has already dealt with a lot of loss in her life and struggles with addiction. Will reach back out to [facility] to see if she can help to support her.

## 2018-10-21 ENCOUNTER — Encounter: Payer: Self-pay | Admitting: Licensed Clinical Social Worker

## 2018-10-28 ENCOUNTER — Telehealth: Payer: Self-pay | Admitting: Licensed Clinical Social Worker

## 2018-10-28 NOTE — Telephone Encounter (Signed)
Patient was contacted due to a referral. Patient did not answer, and a voicemail was left for the patient.

## 2018-11-03 ENCOUNTER — Encounter: Payer: Self-pay | Admitting: Internal Medicine

## 2018-11-03 ENCOUNTER — Other Ambulatory Visit: Payer: Self-pay | Admitting: Internal Medicine

## 2018-11-03 ENCOUNTER — Encounter: Payer: Self-pay | Admitting: Licensed Clinical Social Worker

## 2018-11-03 ENCOUNTER — Ambulatory Visit (INDEPENDENT_AMBULATORY_CARE_PROVIDER_SITE_OTHER): Payer: Medicaid Other | Admitting: Licensed Clinical Social Worker

## 2018-11-03 DIAGNOSIS — F32A Depression, unspecified: Secondary | ICD-10-CM

## 2018-11-03 DIAGNOSIS — F329 Major depressive disorder, single episode, unspecified: Secondary | ICD-10-CM

## 2018-11-03 DIAGNOSIS — F332 Major depressive disorder, recurrent severe without psychotic features: Secondary | ICD-10-CM

## 2018-11-03 DIAGNOSIS — F419 Anxiety disorder, unspecified: Secondary | ICD-10-CM

## 2018-11-03 DIAGNOSIS — F1120 Opioid dependence, uncomplicated: Secondary | ICD-10-CM | POA: Diagnosis not present

## 2018-11-03 DIAGNOSIS — M79671 Pain in right foot: Secondary | ICD-10-CM

## 2018-11-03 MED ORDER — FLUOXETINE HCL 40 MG PO CAPS: 40.0000 mg | ORAL_CAPSULE | Freq: Every day | ORAL | 5 refills | Status: DC

## 2018-11-03 MED ORDER — BUPRENORPHINE HCL-NALOXONE HCL 8-2 MG SL FILM: 1.0000 | ORAL_FILM | Freq: Two times a day (BID) | SUBLINGUAL | 0 refills | Status: DC

## 2018-11-03 MED ORDER — GABAPENTIN 600 MG PO TABS: 600.0000 mg | ORAL_TABLET | Freq: Two times a day (BID) | ORAL | 5 refills | Status: DC
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
## 2018-11-04 MED ORDER — CLONAZEPAM 1 MG PO TABS: 1.0000 mg | ORAL_TABLET | Freq: Every day | ORAL | 0 refills | Status: DC

## 2018-11-10 ENCOUNTER — Telehealth: Payer: Self-pay | Admitting: Licensed Clinical Social Worker

## 2018-11-10 ENCOUNTER — Ambulatory Visit: Payer: Medicaid Other | Admitting: Licensed Clinical Social Worker

## 2018-11-10 NOTE — Telephone Encounter (Signed)
Patient was contacted twice for her scheduled appointment. Patient did not answer, and a voicemail was left for the patient to contact our office to reschedule if she would like to.

## 2018-12-29 ENCOUNTER — Telehealth: Payer: Self-pay | Admitting: *Deleted
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
## 2018-12-29 NOTE — Telephone Encounter (Signed)
Sounds reasonable. Thank you. 

## 2019-01-05 ENCOUNTER — Other Ambulatory Visit: Payer: Self-pay

## 2019-01-05 ENCOUNTER — Ambulatory Visit (INDEPENDENT_AMBULATORY_CARE_PROVIDER_SITE_OTHER): Payer: Medicaid Other | Admitting: Internal Medicine

## 2019-01-05 DIAGNOSIS — Z79899 Other long term (current) drug therapy: Secondary | ICD-10-CM

## 2019-01-05 DIAGNOSIS — F419 Anxiety disorder, unspecified: Secondary | ICD-10-CM

## 2019-01-05 DIAGNOSIS — F329 Major depressive disorder, single episode, unspecified: Secondary | ICD-10-CM

## 2019-01-05 DIAGNOSIS — G40909 Epilepsy, unspecified, not intractable, without status epilepticus: Secondary | ICD-10-CM

## 2019-01-05 DIAGNOSIS — F1120 Opioid dependence, uncomplicated: Secondary | ICD-10-CM | POA: Diagnosis present

## 2019-01-05 DIAGNOSIS — F32A Depression, unspecified: Secondary | ICD-10-CM

## 2019-01-05 DIAGNOSIS — F1590 Other stimulant use, unspecified, uncomplicated: Secondary | ICD-10-CM | POA: Diagnosis not present

## 2019-01-05 DIAGNOSIS — M79671 Pain in right foot: Secondary | ICD-10-CM

## 2019-01-05 MED ORDER — CLONAZEPAM 1 MG PO TABS: 1.0000 mg | ORAL_TABLET | Freq: Every day | ORAL | 0 refills | Status: DC

## 2019-01-05 MED ORDER — BUPRENORPHINE HCL-NALOXONE HCL 8-2 MG SL FILM: 1.0000 | ORAL_FILM | Freq: Two times a day (BID) | SUBLINGUAL | 0 refills | Status: DC

## 2019-01-05 MED ORDER — TOPIRAMATE 100 MG PO TABS: 100.0000 mg | ORAL_TABLET | Freq: Two times a day (BID) | ORAL | 1 refills | Status: AC

## 2019-01-05 MED ORDER — TOPIRAMATE 100 MG PO TABS: 100.0000 mg | ORAL_TABLET | Freq: Two times a day (BID) | ORAL | 1 refills | Status: DC

## 2019-01-05 MED ORDER — GABAPENTIN 600 MG PO TABS: 600.0000 mg | ORAL_TABLET | Freq: Two times a day (BID) | ORAL | 5 refills | Status: AC

## 2019-01-05 MED ORDER — LEVETIRACETAM 750 MG PO TABS: 750.0000 mg | ORAL_TABLET | Freq: Two times a day (BID) | ORAL | 1 refills | Status: AC

## 2019-01-05 MED ORDER — FLUOXETINE HCL 40 MG PO CAPS: 40.0000 mg | ORAL_CAPSULE | Freq: Every day | ORAL | 5 refills | Status: DC

## 2019-01-05 MED ORDER — LEVETIRACETAM 750 MG PO TABS: 750.0000 mg | ORAL_TABLET | Freq: Two times a day (BID) | ORAL | 1 refills | Status: DC

## 2019-01-05 NOTE — Assessment & Plan Note (Signed)
Patient presents for re-induction on Suboxone after relapsing back on to heroin due to recent life stressors. She last injected 2 days ago. No signs or symptoms indicative of infection. She does appear to be in active withdrawal with COWS score of 16. Instruction provided on home induction process. Will plan to continue with 8-2 mg BID. She will need close follow-up with 1 week telehealth visit.

## 2019-01-05 NOTE — Assessment & Plan Note (Signed)
Refilled 1 week supply of Klonopin until she can follow-up with PCP.

## 2019-01-05 NOTE — Assessment & Plan Note (Signed)
Will refill month's supply of Topamax and Keppra while she continues to work on getting established with neurologist.

## 2019-01-05 NOTE — Telephone Encounter (Signed)
Pt needs all medicine to [place], [place]  pt will no longer use to  [address] ([place]), [place] [address]  Please resend all medicine that was sent to [place] ([place]), [place], [place] send Suboxene to [place], [place]

## 2019-01-05 NOTE — Patient Instructions (Addendum)
Ms. Crist, It was nice meeting you. Today we discussed your opioid use disorder and induction back on Suboxone.   We will give you a call in 1 week to see how you are doing.   Take care, Dr. Koleen Distance   Instruction for starting buprenorphine-naloxone (Suboxone) at home  You should not mix buprenorphine-naloxone with other drugs especially large amounts of alcohol or benzodiazepines (Valium, Klonopin, Xanax, Ativan). If you have taken any of these medication, please tell your healthcare team and do not take buprenorphine-naloxone.   You must wait until you are feeling signs of withdrawal from opiates (heroin, pain pills) before you take buprenorphine-naloxone.  If you do not wait long enough the medication will make you sicker.  If you do take it too soon and get sicker then wait until later when you feel signs of withdrawal listed below and then try again.   Signs that you are withdrawing: ? Anxiety, restlessness, can't sit still ? Aches ? Nausea or sick to your stomach ? Goose-bumps ? Racing heart   You should have ALL of these symptoms before you start taking your first dose of buprenorphine-naloxone. If you are not sure call your healthcare team.    When it's time to take your first dose 1. Split your pill or film in half 2. Make sure your mouth is empty of everything (no candy/gum/etc) 3. Sit or stand, but do not lie down 4. Swallow a sip of water to wet your mouth  5. Put the half of the tablet or film under your tongue. Do not suck or swallow it. It must stay there until it is completely dissolved. Try to not even swallow your spit during this time. Anything that you swallow will not make you feel better.   In 20 minutes: You should start feeling a little better. If you feel worse then you started too early so you would wait a few hours and then try again later.    In one hour: You can take the other half of the pill or film the same way you took the first one.   In 2 hours:  if you are still feeling symptoms of withdrawal listed above you can take another half a pill or film. You can repeat this if needed until you take a total of 2 pills or 2 films (16mg ). You may need less than this to control your symptoms.  You should adjust your dose so that you are taking one and half or two pills or films per day (12-16mg  per day). At this dose you should have cut down on cravings and help with any withdrawal symptoms.   The next day:  In the morning you can take the same amount you took yesterday all at one time in the morning.  Expect a call from your team to see how you are doing.   If you have any questions or concerns at any time call your healthcare team.  Clinic Number:  [place]: [phone] After Hours Number: [phone] - - Leave your number and expect a call back from a physician.

## 2019-01-05 NOTE — Telephone Encounter (Signed)
Contacted [facility] on [address] to cancel suboxone. Pharmacist states patient had already called her to cancel since she wanted refill in [place]. Hubbard Hartshorn, RN, BSN

## 2019-01-05 NOTE — Progress Notes (Signed)
Internal Medicine Clinic Attending  I saw and evaluated the patient.  I personally confirmed the key portions of the history and exam documented by Dr. Koleen Distance and I reviewed pertinent patient test results.  The assessment, diagnosis, and plan were formulated together and I agree with the documentation in the resident's note.  Anxiety is uncontrolled currently due to substance use disorder. We are going to get back on treatment for the OUD with suboxone, and I have asked Dessie Coma to follow up with the patient by phone in a few days. Hopefully she will be better able to engage in her care. 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 The klonopin is a short term prescription while we try to stabilize her anxiety.

## 2019-01-05 NOTE — Telephone Encounter (Signed)
Patient called back to inform us that Elizabeth Clarke is out of suboxone both in [place] and [place]. Earliest it will be in is tomorrow afternoon or possibly Thursday AM. Unable to send to different pharmacy per medicaid rule. Hubbard Hartshorn, RN, BSN

## 2019-01-05 NOTE — Progress Notes (Signed)
01/05/2019  Elizabeth Clarke presents for follow up of opioid use disorder I have reviewed the prior induction visit, follow up visits, and telephone encounters relevant to opiate use disorder (OUD) treatment.   Current daily dose: previously on 16 mg daily   Date of Induction: previously inducted 07/28/18  Current follow up interval, in weeks: 1 week  The patient has not been adherent with the buprenorphine for OUD contract.   Last UDS Result: Appropriate for suboxone, positive for methamphetamine   HPI: Elizabeth Clarke is a 33 y/o woman with OUD who presents for treatment with buprenorphine. She has previously been induced in our clinic back in February. She has had several stressors the last few weeks, including finding her fiance dead a month ago which triggered her most recent relapse. She endorses intermittent heroin and methamphetamine use. Last heroin injection was 2 days ago. Last methamphetamine use was yesterday. She feels she is currently withdrawing. Endorses rhinorrhea, restlessness, diarrhea, and body aches. She is motivated to get off of heroin to be able to better care for her daughter.  No fevers, chills, severe back pain, skin changes at injection sites.   Exam:   Vitals:   01/05/19 0957  BP: (!) 146/99  Pulse: (!) 104  Temp: 98.3 F (36.8 C)  TempSrc: Oral  SpO2: 100%  Weight: 181 lb 3.2 oz (82.2 kg)  Height: 5\' 9"  (1.753 m)   General: awake, alert, tearful, NAD Eyes: conjunctiva normal CV: tachycardic, regular rhythm Pulm: normal work of breathing; lungs CTAB Skin: injection marks to bilateral forearms without erythema, warmth or edema  MSK: no lower extremity edema   Clinical Opiate Withdrawal Scale: bold applicable COWS scoring   - Resting HR:    - 0 for < 80   - 1 for 81 - 100   - 2 for 101 - 120   - 4 for > 120  - Sweating:   - 0 for no chills/flushing   - 1 for subjective chills/flushing   - 3 for beads of sweat on brow/face   - 4 for sweat  streaming off of face  - Restlessness:    - 0 for able to sit still   - 1 for subjective difficulty sitting still   - 3 for frequent shifting or extraneous movement   - 5 for unable to sit still for more than a few seconds  - Pupil size:    - 0 for pinpoint or normal   - 1 for possibly larger than normal   - 2 for moderately dilated   - 5 for only iris rim visible  - Bone/joint pain:    - 0 for not present   - 1 for mild diffuse discomfort   - 2 severe diffuse aching   - 4 for objectively rubbing joints/muscles and obviously in pain  - Runny nose/tearing:    - 0 for not present   - 1 for stuffy nose/moist eyes   - 2 for nose running/tearing   - 4 for nose constantly running or tears streaming down cheeks  - GI Upset:    - 0 for no GI symptoms   - 1 for stomach cramps   - 2 for nausea or loose stool   - 3 for vomiting or diarrhea   - 5 for multiple episodes of vomiting or diarrhea  - Tremor observation of outstretched hands:    - 0 for no tremor   - 1 for tremor can be felt  but not observed   - 2 for slight tremor observable   - 4 for gross tremor or muscle twitching  - Yawning:    - 0 for no yawning   - 1 for yawning once or twice during assessment   - 2 for yawning three or more times during assessment   - 4 for yawning several times per minute  - Anxiety or irritability:    - 0 for none   - 1 for patient reports increasing irritability or anxiousness   - 2 for patient obviously irritable/anxious   - 4 for patient so irritable/anxious that assessment is difficult  - Gooseflesh:    - 0 for skin is smooth   - 3 for piloerection of skin can be felt or seen   - 5 for prominent piloerection  TOTAL: 16   Assessment/Plan:  See Problem Based Charting in the Encounters Tab     [place]ena Nunnery D, DO  01/05/2019  10:00 AM

## 2019-01-07 ENCOUNTER — Ambulatory Visit (INDEPENDENT_AMBULATORY_CARE_PROVIDER_SITE_OTHER): Payer: Medicaid Other | Admitting: Licensed Clinical Social Worker

## 2019-01-07 ENCOUNTER — Encounter: Payer: Self-pay | Admitting: Licensed Clinical Social Worker

## 2019-01-07 ENCOUNTER — Telehealth: Payer: Self-pay | Admitting: Student in an Organized Health Care Education/Training Program

## 2019-01-07 ENCOUNTER — Other Ambulatory Visit: Payer: Self-pay

## 2019-01-07 DIAGNOSIS — F332 Major depressive disorder, recurrent severe without psychotic features: Secondary | ICD-10-CM

## 2019-01-07 DIAGNOSIS — F419 Anxiety disorder, unspecified: Secondary | ICD-10-CM

## 2019-01-07 DIAGNOSIS — F1120 Opioid dependence, uncomplicated: Secondary | ICD-10-CM

## 2019-01-07 MED ORDER — CLONAZEPAM 1 MG PO TABS: 1.0000 mg | ORAL_TABLET | Freq: Every day | ORAL | 0 refills | Status: AC
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
## 2019-01-07 NOTE — Telephone Encounter (Signed)
Spoke with patient. Pharmacy warned about filling a 1 week supply of clonazepam as insurance may not cover the next refill in less than 30 days. I have changed the Rx to a 30 day supply. Patient understands this is not intended to be a long term prescription, but one month of treatment is reasonable.

## 2019-01-10 ENCOUNTER — Other Ambulatory Visit: Payer: Self-pay | Admitting: Student in an Organized Health Care Education/Training Program

## 2019-01-10 DIAGNOSIS — F419 Anxiety disorder, unspecified: Secondary | ICD-10-CM

## 2019-01-12 ENCOUNTER — Ambulatory Visit (INDEPENDENT_AMBULATORY_CARE_PROVIDER_SITE_OTHER): Payer: Medicaid Other | Admitting: Internal Medicine

## 2019-01-12 ENCOUNTER — Other Ambulatory Visit: Payer: Self-pay

## 2019-01-12 DIAGNOSIS — F1120 Opioid dependence, uncomplicated: Secondary | ICD-10-CM

## 2019-01-12 MED ORDER — BUPRENORPHINE HCL-NALOXONE HCL 8-2 MG SL FILM: 1.0000 | ORAL_FILM | Freq: Two times a day (BID) | SUBLINGUAL | 0 refills | Status: DC

## 2019-01-12 NOTE — Addendum Note (Signed)
Addended by: Joni Reining C on: 01/12/2019 10:21 AM   Modules accepted: Orders

## 2019-01-12 NOTE — Assessment & Plan Note (Signed)
Overall seems improved.  Is motivated to continue with Suboxone therapy.  I suspect her diarrhea will improve soon.  Advised her to continue Imodium as needed discussed not to take more than the manufacturers advised total daily limit. I will keep her on Suboxone 8-2 mg 2 tablets daily.  And I will have her follow-up with an in person visit in 2 weeks.  We will plan to check a urine drug screen at that time.

## 2019-01-12 NOTE — Progress Notes (Signed)
   01/12/2019  [facility] Telephone Encounter Continuity Care Appointment  HPI:   This telephone encounter was created for Ms. Elizabeth Clarke on 01/12/2019 for the following purpose/cc OUD. Elizabeth Clarke presents for follow up of opioid use disorder I have reviewed the prior induction visit, follow up visits, and telephone encounters relevant to opiate use disorder (OUD) treatment.   Current daily dose:16 mg daily in divided doses  Date of Induction: previously inducted 07/28/18  Current follow up interval, in weeks: 1 week  The patient has  been adherent with the buprenorphine for OUD contract.  Last UDS Result: Appropriate for suboxone, positive for methamphetamine   HPI: Elizabeth Clarke is a 33 y/o woman with OUD who presents for treatment with buprenorphine.  She had some trouble picking up her Suboxone prescription due to the pharmacy not having the medication readily available.  She reports this caused a 1 to 2-day delay in starting.  Since she started Suboxone she feels that her cravings have been better controlled she reports no relapse to other opioids.  Her anxiety has also improved however she reports that she has continued to have severe diarrhea since the onset of withdrawal symptoms.  She is hopeful that this will improve within the next day or 2.  She has been taking Imodium over-the-counter for this complaint.  She notes that she had a behavioral health tele-visit with [facility] that went well she is looking forward to once weekly visits.  She has a court date today about a Estate manager/land agent.   Past Medical History:  Past Medical History:  Diagnosis Date  . Chronic viral hepatitis C ([place]) 07/10/2018  . Diabetes mellitus without complication ([place])   . Seizures ([facility])       ROS:   No fever or chills.  Positive for diarrhea positive for anxiety.   Assessment / Plan / Recommendations:   Please see A&P under problem oriented charting for  assessment of the patient's acute and chronic medical conditions.   As always, pt is advised that if symptoms worsen or new symptoms arise, they should go to an urgent care facility or to to ER for further evaluation.   Consent and Medical Decision Making:    This is a telephone encounter between Elizabeth Clarke and Lucious Groves on 01/12/2019 for OUD. The visit was conducted with the patient located at [place] (patient reported area was private and wanted to proceed with visit) and Lucious Groves at [facility]. The patient's identity was confirmed using their DOB and current address. The patient has consented to being evaluated through a telephone encounter and understands the associated risks (an examination cannot be done and the patient may need to come in for an appointment) / benefits (allows the patient to remain at home, decreasing exposure to coronavirus). I personally spent 8 minutes on medical discussion.     Assessment/Plan:  See Problem Based Charting in the Encounters Tab     Lucious Groves, DO  01/12/2019  9:41 AM

## 2019-01-13 ENCOUNTER — Other Ambulatory Visit: Payer: Self-pay

## 2019-01-13 ENCOUNTER — Encounter: Payer: Self-pay | Admitting: Licensed Clinical Social Worker

## 2019-01-13 ENCOUNTER — Ambulatory Visit (INDEPENDENT_AMBULATORY_CARE_PROVIDER_SITE_OTHER): Payer: Medicaid Other | Admitting: Licensed Clinical Social Worker

## 2019-01-13 DIAGNOSIS — F1120 Opioid dependence, uncomplicated: Secondary | ICD-10-CM

## 2019-01-13 DIAGNOSIS — F419 Anxiety disorder, unspecified: Secondary | ICD-10-CM

## 2019-01-13 DIAGNOSIS — F332 Major depressive disorder, recurrent severe without psychotic features: Secondary | ICD-10-CM

## 2019-01-13 NOTE — BH Specialist Note (Signed)
Integrated Behavioral Health Visit via Telemedicine (Telephone)  01/13/2019 SHAYLIE EKLUND [id number]   Session Start time: 11:30  Session End time: 12:00 Total time: 30 minutes  Referring Provider: Dr. Heber [place] Type of Visit: Telephonic Patient location: Home [facility] Provider location: Remote All persons participating in visit: [facility] and patient  Confirmed patient's address: Yes  Confirmed patient's phone number: Yes  Any changes to demographics: No   Discussed confidentiality: Yes    The following statements were read to the patient and/or legal guardian that are established with the [facility] Provider.  "The purpose of this phone visit is to provide behavioral health care while limiting exposure to the coronavirus (COVID19).  There is a possibility of technology failure and discussed alternative modes of communication if that failure occurs."  "By engaging in this telephone visit, you consent to the provision of healthcare.  Additionally, you authorize for your insurance to be billed for the services provided during this telephone visit."   Patient and/or legal guardian consented to telephone visit: Yes   PRESENTING CONCERNS: Patient and/or family reports the following symptoms/concerns: severe grief, history of trauma, substance use/recovery, depression, and anxiety.  Duration of problem: increased over the past several months; Severity of problem: moderate  STRENGTHS (Protective Factors/Coping Skills): Writing in her notebook.   GOALS ADDRESSED: Patient will: 1.  Reduce symptoms of: agitation, anxiety, depression, mood instability, stress and substance abuse.  2.  Increase knowledge and/or ability of: coping skills, healthy habits, self-management skills and stress reduction  3.  Demonstrate ability to: Increase healthy adjustment to current life circumstances, Increase adequate support systems for patient/family, Improve medication compliance and Decrease  self-medicating behaviors  INTERVENTIONS: Interventions utilized:  Motivational Interviewing, Solution-Focused Strategies, Brief CBT and Supportive Counseling Standardized Assessments completed: assessed for SI, HI, and self-harm.  ASSESSMENT: Patient currently experiencing ongoing stress and depression. Patient reported she is experiencing mild withdrawal symptoms and urges to engage in substance use are decreasing. Patient wants to remain sober because her "daughter deserves a mother". Patient is currently facing felony charges, and had court yesterday (case was continued). Patient processed triggers to engage in substance use. Patient reported having a positive natural support is helpful when she is triggered. Patient processed some of her family is negative, and will bring up her past mistakes.    Patient processed her role as a mother, and her goal to have a relationship with her daughter.   Patient identified her anger, communication, and guilt as her current areas for improvement. Patient would like to build her support system, and recognizes staying in recovery will assist her in reaching this goal.   Patient may benefit from weekly outpatient therapy.  PLAN: 1. Follow up with behavioral health clinician on : one week via phone.   Dessie Coma, [facility], [place]

## 2019-01-21 ENCOUNTER — Ambulatory Visit: Payer: Medicaid Other | Admitting: Licensed Clinical Social Worker

## 2019-01-21 ENCOUNTER — Telehealth: Payer: Self-pay | Admitting: Licensed Clinical Social Worker

## 2019-01-21 NOTE — Telephone Encounter (Signed)
Patient was called at her scheduled appointment time, and requested to be called back 10 minutes later. Patient was called a second time, and did not answer. A voicemail was left for the patient. Pt is on my schedule for 8/4.

## 2019-01-26 ENCOUNTER — Ambulatory Visit: Payer: Medicaid Other | Admitting: Licensed Clinical Social Worker

## 2019-01-26 ENCOUNTER — Telehealth: Payer: Self-pay

## 2019-01-26 MED ORDER — BUPRENORPHINE HCL-NALOXONE HCL 8-2 MG SL FILM: 1.0000 | ORAL_FILM | Freq: Two times a day (BID) | SUBLINGUAL | 0 refills | Status: DC

## 2019-01-26 NOTE — Telephone Encounter (Signed)
Spoke with patient by phone. Her parole officer made her come to their appointment earlier than expected which is why she missed our appointment. She is doing ok, lots of stress right now and worried about high cravings. Reports good compliance with suboxone. Wants to continue with two daily and establish with Dr. Philipp Ovens for her other medical needs. I am going to refill a 1 week supply. Would like to see her next Tuesday at 8:45, she confirmed that time works for her.

## 2019-01-26 NOTE — Telephone Encounter (Signed)
Pls contact regarding OUD, pt had court this morning [phone]

## 2019-02-02 ENCOUNTER — Ambulatory Visit: Payer: Medicaid Other

## 2019-02-02 ENCOUNTER — Telehealth: Payer: Self-pay | Admitting: Student in an Organized Health Care Education/Training Program

## 2019-02-02 MED ORDER — BUPRENORPHINE HCL-NALOXONE HCL 8-2 MG SL FILM: 1.0000 | ORAL_FILM | Freq: Two times a day (BID) | SUBLINGUAL | 0 refills | Status: DC

## 2019-02-02 NOTE — Telephone Encounter (Signed)
Patient presented today for an OUD visit with the [facility], however she was denied entry to the clinic because of a positive COVID screening question. Apparently she has been in contact with a person with COVID19 and she currently has a test pending. We will cancel her appointment today and reschedule for 2 weeks from now. I will send a 2 week prescription for suboxone to continue treating her OUD.

## 2019-02-17 ENCOUNTER — Other Ambulatory Visit: Payer: Self-pay | Admitting: Internal Medicine

## 2019-02-17 NOTE — Telephone Encounter (Signed)
Requesting to speak with helen. Please call back.

## 2019-02-17 NOTE — Telephone Encounter (Signed)
Patient missed our last visit because she had a recent covid exposure. I am not sure the results of her testing. She has an appointment with OUD on 9/1. I will refill a 1 week supply of suboxone and we will plan on seeing her next week.

## 2019-02-23 ENCOUNTER — Ambulatory Visit: Payer: Medicaid Other

## 2019-08-24 DIAGNOSIS — G2581 Restless legs syndrome: Secondary | ICD-10-CM

## 2019-08-24 DIAGNOSIS — M609 Myositis, unspecified: Secondary | ICD-10-CM | POA: Diagnosis not present

## 2019-08-24 DIAGNOSIS — L02413 Cutaneous abscess of right upper limb: Secondary | ICD-10-CM | POA: Diagnosis not present

## 2019-08-24 DIAGNOSIS — R569 Unspecified convulsions: Secondary | ICD-10-CM | POA: Diagnosis not present

## 2019-08-25 ENCOUNTER — Telehealth: Payer: Self-pay | Admitting: *Deleted

## 2019-08-25 DIAGNOSIS — L02413 Cutaneous abscess of right upper limb: Secondary | ICD-10-CM | POA: Diagnosis not present

## 2019-08-25 DIAGNOSIS — M609 Myositis, unspecified: Secondary | ICD-10-CM | POA: Diagnosis not present

## 2019-08-25 DIAGNOSIS — G2581 Restless legs syndrome: Secondary | ICD-10-CM | POA: Diagnosis not present

## 2019-08-25 DIAGNOSIS — R569 Unspecified convulsions: Secondary | ICD-10-CM | POA: Diagnosis not present

## 2019-08-25 NOTE — Telephone Encounter (Signed)
Call from [facility] - stated pt stated she recently had Klonopin and Tranxene refills at this office. Reviewed chart, I did not find any info to confirm this. Informed last refill was 01/07/19 for Klonopin. Geraldine Contras stated she was unable to find info also.

## 2019-08-26 DIAGNOSIS — L02413 Cutaneous abscess of right upper limb: Secondary | ICD-10-CM | POA: Diagnosis not present

## 2019-08-26 DIAGNOSIS — R569 Unspecified convulsions: Secondary | ICD-10-CM | POA: Diagnosis not present

## 2019-08-26 DIAGNOSIS — G2581 Restless legs syndrome: Secondary | ICD-10-CM | POA: Diagnosis not present

## 2019-08-26 DIAGNOSIS — M609 Myositis, unspecified: Secondary | ICD-10-CM | POA: Diagnosis not present

## 2019-08-27 DIAGNOSIS — M609 Myositis, unspecified: Secondary | ICD-10-CM | POA: Diagnosis not present

## 2019-08-27 DIAGNOSIS — G2581 Restless legs syndrome: Secondary | ICD-10-CM | POA: Diagnosis not present

## 2019-08-27 DIAGNOSIS — L02413 Cutaneous abscess of right upper limb: Secondary | ICD-10-CM | POA: Diagnosis not present

## 2019-08-27 DIAGNOSIS — R569 Unspecified convulsions: Secondary | ICD-10-CM | POA: Diagnosis not present

## 2019-08-28 DIAGNOSIS — G2581 Restless legs syndrome: Secondary | ICD-10-CM | POA: Diagnosis not present

## 2019-08-28 DIAGNOSIS — M609 Myositis, unspecified: Secondary | ICD-10-CM | POA: Diagnosis not present

## 2019-08-28 DIAGNOSIS — L02413 Cutaneous abscess of right upper limb: Secondary | ICD-10-CM | POA: Diagnosis not present

## 2019-08-28 DIAGNOSIS — R569 Unspecified convulsions: Secondary | ICD-10-CM | POA: Diagnosis not present

## 2020-01-26 ENCOUNTER — Other Ambulatory Visit: Payer: Self-pay | Admitting: Student in an Organized Health Care Education/Training Program

## 2020-01-26 DIAGNOSIS — F32A Depression, unspecified: Secondary | ICD-10-CM

## 2020-01-31 ENCOUNTER — Telehealth: Payer: Self-pay

## 2020-01-31 NOTE — Telephone Encounter (Signed)
Pt would like to return to OUD clinic, used heroin 2 days ago, injected, first appt available 8/17   Also would like to become a pt in [facility] for primary care  Sending to OUD doctors and asking dr Criselda Peaches about primary care will also send to doris s.

## 2020-01-31 NOTE — Telephone Encounter (Signed)
Requesting OUD appt, please call pt back.  

## 2020-02-01 NOTE — Telephone Encounter (Signed)
rtc to pt, message stated no vmail setup, will rtc later

## 2020-02-01 NOTE — Telephone Encounter (Signed)
Reviewed chart.  Ms. Guiffre never effectively stabilized in clinic with [place] and had significant other use disorder including amphetamine use disorder.  I am happy to have her back in clinic and give it another chance for her to stabilize.  If she would prefer to have PC here as well, we can work her in.  She had a significant amount of other issues.    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  If she continues to need multi-modal care, she may benefit from a different clinic model in future.   Debe Coder, MD

## 2020-02-02 NOTE — Telephone Encounter (Signed)
Scheduled for 8/24 at 0845

## 2020-02-15 ENCOUNTER — Ambulatory Visit (INDEPENDENT_AMBULATORY_CARE_PROVIDER_SITE_OTHER): Payer: Medicaid Other | Admitting: Internal Medicine

## 2020-02-15 ENCOUNTER — Other Ambulatory Visit: Payer: Self-pay

## 2020-02-15 VITALS — BP 97/57 | HR 85 | Temp 99.3°F | Ht 69.0 in | Wt 214.5 lb

## 2020-02-15 DIAGNOSIS — F1120 Opioid dependence, uncomplicated: Secondary | ICD-10-CM

## 2020-02-15 MED ORDER — BUPRENORPHINE HCL-NALOXONE HCL 8-2 MG SL FILM: 1.0000 | ORAL_FILM | Freq: Two times a day (BID) | SUBLINGUAL | 0 refills | Status: DC

## 2020-02-15 NOTE — Progress Notes (Signed)
   02/15/2020  Elizabeth Clarke presents for follow up of opioid use disorder I have reviewed the prior induction visit, follow up visits, and telephone encounters relevant to opiate use disorder (OUD) treatment.   Current daily dose: previously on 16 mg daily   Date of Induction: previously inducted 07/28/18, returning today to reestablish care  Current follow up interval, in weeks: 1 week  The patient has not been adherent with the buprenorphine for OUD contract.   Last UDS Result: >69 year old.  HPI: Elizabeth Clarke is a 34 y/o woman with OUD who presents for treatment with buprenorphine.  She was last seen by her clinic about a year ago.  She had moderate success with Suboxone therapy previously.  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  She represents for treatment feeling that she is better spot in her life to be adherent to Suboxone therapy.  She reports she is very motivated to stick with outpatient Suboxone therapy this time.  Exam:   Vitals:   02/15/20 0921 02/15/20 0924  BP:  (!) 97/57  Pulse:  85  Temp:  99.3 F (37.4 C)  TempSrc:  Oral  SpO2:  97%  Weight: 214 lb 8 oz (97.3 kg)   Height: 5\' 9"  (1.753 m)    Gen: appropriate affect, calm Pulm: normal effor Skin: few non infected areas of suspect injection use in forarms. Surgical scar right arm.  Assessment/Plan:  See Problem Based Charting in the Encounters Tab     , DO  02/15/2020  1:22 PM

## 2020-02-15 NOTE — Assessment & Plan Note (Addendum)
Restart Suboxone 8-2 mg films use 1 film twice daily.  Follow-up in 1 week.  Check U tox today. Referral to behavioral health, previouly benefited greatly from seeing [facility] hicks.

## 2020-02-18 LAB — TOXASSURE SELECT,+ANTIDEPR,UR

## 2020-02-22 ENCOUNTER — Other Ambulatory Visit: Payer: Self-pay | Admitting: Student in an Organized Health Care Education/Training Program

## 2020-02-22 DIAGNOSIS — F32A Depression, unspecified: Secondary | ICD-10-CM

## 2020-02-25 ENCOUNTER — Other Ambulatory Visit: Payer: Self-pay | Admitting: *Deleted

## 2020-02-25 MED ORDER — BUPRENORPHINE HCL-NALOXONE HCL 8-2 MG SL FILM: 1.0000 | ORAL_FILM | Freq: Two times a day (BID) | SUBLINGUAL | 0 refills | Status: DC

## 2020-02-25 NOTE — Telephone Encounter (Signed)
Requesting a refill on her OUD meds

## 2020-03-14 ENCOUNTER — Other Ambulatory Visit: Payer: Self-pay | Admitting: Student in an Organized Health Care Education/Training Program

## 2020-03-14 NOTE — Telephone Encounter (Signed)
Requesting a refill on Suboxone @  [facility] [address] ([address]), [place] - [address] Phone:  [phone]  Fax:  [phone]

## 2020-03-14 NOTE — Telephone Encounter (Signed)
Will approve a 2 week refill. Please make her an inperson follow up appointment within 2 weeks as well. Will need to be seen for next refill.

## 2020-04-04 ENCOUNTER — Other Ambulatory Visit: Payer: Self-pay | Admitting: *Deleted

## 2020-04-04 MED ORDER — SUBOXONE 8-2 MG SL FILM
1.0000 | ORAL_FILM | Freq: Two times a day (BID) | SUBLINGUAL | 0 refills | Status: DC
Start: 2020-04-04 — End: 2020-04-20

## 2020-04-04 NOTE — Telephone Encounter (Signed)
Call placed to patient for missed appt today. Requested call back before 1100 to do visit by tele. Kinnie Feil, BSN, RN-BC

## 2020-04-04 NOTE — Telephone Encounter (Signed)
Patient returned call. States she is on day 2 of fever 101, H/A, nasal congestion, slight unproductive cough, no taste, vomiting/diarrhea. Denies SHOB. States she was hospitalized with Covid for 14 days at the end of Dec 2020. States she is going to hospital for Covid testing. Advised patient to call [phone] to schedule covid test.   Patient is out of suboxone and requesting refill. She can be reached at [phone]. Kinnie Feil, BSN, RN-BC

## 2020-04-04 NOTE — Telephone Encounter (Signed)
Probably. It seems unlikely this current febrile illness is covid if she already had covid in December. But we can decide when we have more information.

## 2020-04-04 NOTE — Telephone Encounter (Signed)
Ok. Can you send me the refill request for the suboxone please? That way I can pull up the PDMP through epic and check her history. Thanks.

## 2020-04-04 NOTE — Telephone Encounter (Signed)
Patient last evaluated in clinic on 8/24. Last refill was 9/21 and we asked her to follow up for next refill. She missed today's visit, seemingly due to an acute febrile illness. We appreciate her not coming into clinic in that case. I will refill a 2 week supply. Please help her to make an in person appointment with [place] in 2 weeks for next refill.

## 2020-04-05 ENCOUNTER — Telehealth: Payer: Self-pay

## 2020-04-05 NOTE — Telephone Encounter (Signed)
Pt wanted to let the nurse know, that her COVID test was neg but daughter was positive [phone]

## 2020-04-05 NOTE — Telephone Encounter (Signed)
Returned call to patient. She has been scheduled for in person visit on 04/18/2020 at 0915. Patient's daughter who tested positive for covid is 36 yo and currently admitted. Kinnie Feil, BSN, RN-BC

## 2020-04-05 NOTE — Telephone Encounter (Signed)
Sounds good. I think we can do our plan, I sent a 2 week refill, then she can be seen in clinic I think. It's an exposure, but I don't think that is a reason to not see her in person if she has immunity from past infection.

## 2020-04-19 ENCOUNTER — Encounter (INDEPENDENT_AMBULATORY_CARE_PROVIDER_SITE_OTHER): Payer: Self-pay

## 2020-04-20 ENCOUNTER — Telehealth: Payer: Self-pay | Admitting: *Deleted

## 2020-04-20 MED ORDER — SUBOXONE 8-2 MG SL FILM
1.0000 | ORAL_FILM | Freq: Two times a day (BID) | SUBLINGUAL | 0 refills | Status: AC
Start: 2020-04-20 — End: ?

## 2020-04-20 NOTE — Telephone Encounter (Signed)
From: Ardine Eng  Sent: 04/19/2020  3:57 PM EDT  To: Imp Front Desk Pool  Subject: Appointment Missed                  Hey this is Fredricka Bonine, for the past 2 days I have been calling the clinic and the phones just keep ringing and ringing until it finally cuts me off. Unfortunately on the morning of April 17, 2020 I witnessed my father committ suicide by blowing his brains out and yes I know that I was suppose to have came in for my scheduled appointment but there was no way possible. I'm a mental melt down and all I can do is cry and shake.

## 2020-04-20 NOTE — Telephone Encounter (Signed)
I am very sorry to hear that. I will approve a 1 week refill. She will have to come in next OUD clinic to be seen for next refill. We definitely want to check in with her. Ok to [employer] if needed.

## 2020-04-20 NOTE — Telephone Encounter (Signed)
F/U call to Pt per Nsg request today. Called mobile line listed on chart. No/answer; lft msg for Pt to return call providing Office phone contact w/caveat Pt may not reach Provider-she can always call Clinic phone & be transferred.

## 2020-04-20 NOTE — Telephone Encounter (Signed)
Call placed to patient. Expressed how very sorry we are for what she has just been through. Informed her that the 1 week refill has been sent to [facility] on [address]. In-person appt made for Tuesday, 04/25/2020 at 2:15 with Dr. Barbaraann Faster. Patient would appreciate call from In house psychologist. Kinnie Feil, BSN, RN-BC

## 2020-04-20 NOTE — Telephone Encounter (Signed)
Returned call to patient. Explained Dr. Monna Fam had called earlier and left a message. Patient stated she just received it. Following Crisis Hotline numbers given to Patient:  National Suicide Prevention Lifeline 800-273-TALK 614-010-0588)  Mobile Crisis - [facility]: 24 hour Crisis Response Team 440-394-8002  [facility] ([phone] [address] [place] - instructed patient she can walk in to this clinic  [email or web address]  Patient stated her 73 yo daughter is questioning if grandfather's suicide is her fault. Number given to   [facility] Hotline 800-442-HOPE 548-379-6738)  Patient was very appreciative. Kinnie Feil, BSN, RN-BC

## 2020-04-25 ENCOUNTER — Encounter: Payer: Medicaid Other | Admitting: Internal Medicine

## 2020-05-03 ENCOUNTER — Telehealth: Payer: Self-pay | Admitting: Behavioral Health

## 2021-04-15 ENCOUNTER — Other Ambulatory Visit: Payer: Self-pay | Admitting: Student in an Organized Health Care Education/Training Program

## 2021-04-15 DIAGNOSIS — F32A Depression, unspecified: Secondary | ICD-10-CM

## 2021-04-16 ENCOUNTER — Other Ambulatory Visit: Payer: Self-pay

## 2021-04-16 DIAGNOSIS — F32A Depression, unspecified: Secondary | ICD-10-CM

## 2021-04-16 NOTE — Telephone Encounter (Signed)
This would not be appropriate for [place] to fill as it has been > 1 year since she has been seen.  Will refuse.

## 2021-05-25 ENCOUNTER — Other Ambulatory Visit (HOSPITAL_BASED_OUTPATIENT_CLINIC_OR_DEPARTMENT_OTHER): Payer: Self-pay

## 2021-05-25 MED ORDER — BUPRENORPHINE HCL-NALOXONE HCL 2-0.5 MG SL FILM
ORAL_FILM | SUBLINGUAL | 0 refills | Status: AC
  Filled 2021-05-25: qty 17, 7d supply, fill #0

## 2021-06-01 ENCOUNTER — Other Ambulatory Visit (HOSPITAL_BASED_OUTPATIENT_CLINIC_OR_DEPARTMENT_OTHER): Payer: Self-pay

## 2021-06-01 MED ORDER — BUPRENORPHINE HCL-NALOXONE HCL 2-0.5 MG SL SUBL
SUBLINGUAL_TABLET | SUBLINGUAL | 0 refills | Status: AC
  Filled 2021-06-01: qty 51, 7d supply, fill #0

## 2021-06-08 ENCOUNTER — Other Ambulatory Visit (HOSPITAL_BASED_OUTPATIENT_CLINIC_OR_DEPARTMENT_OTHER): Payer: Self-pay

## 2021-06-08 MED ORDER — BUPRENORPHINE HCL-NALOXONE HCL 8-2 MG SL SUBL
SUBLINGUAL_TABLET | SUBLINGUAL | 0 refills | Status: DC
  Filled 2021-06-08: qty 14, 7d supply, fill #0

## 2021-06-15 ENCOUNTER — Other Ambulatory Visit (HOSPITAL_BASED_OUTPATIENT_CLINIC_OR_DEPARTMENT_OTHER): Payer: Self-pay

## 2021-06-15 MED ORDER — BUPRENORPHINE HCL-NALOXONE HCL 8-2 MG SL FILM
ORAL_FILM | SUBLINGUAL | 0 refills | Status: AC
  Filled 2021-06-15: qty 9, 5d supply, fill #0

## 2021-06-22 ENCOUNTER — Other Ambulatory Visit (HOSPITAL_BASED_OUTPATIENT_CLINIC_OR_DEPARTMENT_OTHER): Payer: Self-pay

## 2021-06-22 MED ORDER — BUPRENORPHINE HCL-NALOXONE HCL 8-2 MG SL SUBL
SUBLINGUAL_TABLET | SUBLINGUAL | 0 refills | Status: DC
  Filled 2021-06-22: qty 17, 6d supply, fill #0

## 2021-07-27 ENCOUNTER — Other Ambulatory Visit (HOSPITAL_BASED_OUTPATIENT_CLINIC_OR_DEPARTMENT_OTHER): Payer: Self-pay

## 2021-07-27 MED ORDER — BUPRENORPHINE HCL-NALOXONE HCL 8-2 MG SL SUBL
SUBLINGUAL_TABLET | SUBLINGUAL | 0 refills | Status: DC
  Filled 2021-07-27: qty 21, 7d supply, fill #0

## 2021-08-20 ENCOUNTER — Other Ambulatory Visit (HOSPITAL_BASED_OUTPATIENT_CLINIC_OR_DEPARTMENT_OTHER): Payer: Self-pay

## 2021-08-20 MED ORDER — BUPRENORPHINE HCL-NALOXONE HCL 8-2 MG SL SUBL
SUBLINGUAL_TABLET | SUBLINGUAL | 0 refills | Status: AC
  Filled 2021-08-20: qty 15, 5d supply, fill #0

## 2021-11-22 ENCOUNTER — Encounter (HOSPITAL_COMMUNITY): Payer: Self-pay

## 2021-11-22 ENCOUNTER — Emergency Department (HOSPITAL_COMMUNITY)
Admission: EM | Admit: 2021-11-22 | Discharge: 2021-11-22 | Attending: Emergency Medicine | Admitting: Emergency Medicine

## 2021-11-22 ENCOUNTER — Other Ambulatory Visit: Payer: Self-pay

## 2021-11-22 ENCOUNTER — Emergency Department (HOSPITAL_COMMUNITY)

## 2021-11-22 DIAGNOSIS — R569 Unspecified convulsions: Secondary | ICD-10-CM | POA: Diagnosis present

## 2021-11-22 DIAGNOSIS — Z91040 Latex allergy status: Secondary | ICD-10-CM | POA: Insufficient documentation

## 2021-11-22 DIAGNOSIS — R739 Hyperglycemia, unspecified: Secondary | ICD-10-CM | POA: Diagnosis not present

## 2021-11-22 DIAGNOSIS — Z79899 Other long term (current) drug therapy: Secondary | ICD-10-CM | POA: Insufficient documentation

## 2021-11-22 DIAGNOSIS — E876 Hypokalemia: Secondary | ICD-10-CM | POA: Insufficient documentation

## 2021-11-22 LAB — COMPREHENSIVE METABOLIC PANEL
ALT: 20 U/L (ref 0–44)
AST: 22 U/L (ref 15–41)
Albumin: 4.7 g/dL (ref 3.5–5.0)
Alkaline Phosphatase: 81 U/L (ref 38–126)
Anion gap: 12 (ref 5–15)
BUN: 20 mg/dL (ref 6–20)
CO2: 18 mmol/L — ABNORMAL LOW (ref 22–32)
Calcium: 9.9 mg/dL (ref 8.9–10.3)
Chloride: 105 mmol/L (ref 98–111)
Creatinine, Ser: 0.98 mg/dL (ref 0.44–1.00)
GFR, Estimated: >60 mL/min (ref >60)
Glucose, Bld: 118 mg/dL — ABNORMAL HIGH (ref 70–99)
Potassium: 3.3 mmol/L — ABNORMAL LOW (ref 3.5–5.1)
Sodium: 135 mmol/L (ref 135–145)
Total Bilirubin: 1 mg/dL (ref 0.3–1.2)
Total Protein: 10.1 g/dL — ABNORMAL HIGH (ref 6.5–8.1)

## 2021-11-22 LAB — CBC WITH DIFFERENTIAL/PLATELET
Abs Immature Granulocytes: 0.03 10*3/uL (ref 0.00–0.07)
Basophils Absolute: 0 10*3/uL (ref 0.0–0.1)
Basophils Relative: 0 %
Eosinophils Absolute: 0 10*3/uL (ref 0.0–0.5)
Eosinophils Relative: 0 %
HCT: 44.2 % (ref 36.0–46.0)
Hemoglobin: 14.7 g/dL (ref 12.0–15.0)
Immature Granulocytes: 1 %
Lymphocytes Relative: 17 %
Lymphs Abs: 1 10*3/uL (ref 0.7–4.0)
MCH: 28.3 pg (ref 26.0–34.0)
MCHC: 33.3 g/dL (ref 30.0–36.0)
MCV: 85.2 fL (ref 80.0–100.0)
Monocytes Absolute: 0.1 10*3/uL (ref 0.1–1.0)
Monocytes Relative: 2 %
Neutro Abs: 4.8 10*3/uL (ref 1.7–7.7)
Neutrophils Relative %: 80 %
Platelets: 395 10*3/uL (ref 150–400)
RBC: 5.19 MIL/uL — ABNORMAL HIGH (ref 3.87–5.11)
RDW: 13.3 % (ref 11.5–15.5)
WBC: 6 10*3/uL (ref 4.0–10.5)
nRBC: 0 % (ref 0.0–0.2)

## 2021-11-22 LAB — I-STAT BETA HCG BLOOD, ED (MC, WL, AP ONLY): I-stat hCG, quantitative: <5 m[IU]/mL (ref <5)

## 2021-11-22 LAB — ETHANOL: Alcohol, Ethyl (B): <10 mg/dL (ref <10)

## 2021-11-22 MED ORDER — LEVETIRACETAM IN NACL 500 MG/100ML IV SOLN
500.0000 mg | Freq: Two times a day (BID) | INTRAVENOUS | Status: DC
  Administered 2021-11-22: 500 mg via INTRAVENOUS
  Filled 2021-11-22: qty 100

## 2021-11-22 MED ORDER — LORAZEPAM 2 MG/ML IJ SOLN
2.0000 mg | INTRAMUSCULAR | Status: DC | PRN
  Administered 2021-11-22: 2 mg via INTRAMUSCULAR
  Filled 2021-11-22: qty 1

## 2021-11-22 NOTE — ED Provider Notes (Signed)
[place] DEPT Provider Note   CSN: [id number] Arrival date & time: 11/22/21  1551     History  No chief complaint on file.   Elizabeth Clarke is a 36 y.o. female.  HPI Pt is a 36yo female presenting to the ED for seizures. She is in police custody, and she was in jail when the seizures occurred. At 12pm she had a seizure witnessed by a police officer that lasted about 15 minutes. The second seizure occurred at 3:30pm and lasted for an unknown amount of time. She has a history of seizure disorder and is currently taking Keppra, and says she has not been compliant with the medication in the last couple of days. She endorses numbness to her face and pain to her jaw. She denies chest pain and SOB.    Home Medications Prior to Admission medications   Medication Sig Start Date End Date Taking? Authorizing Provider  Buprenorphine HCl-Naloxone HCl 2-0.5 MG FILM See patient instructions 05/25/21   Leticia Penna, MD  Buprenorphine HCl-Naloxone HCl 8-2 MG FILM Dissolve 1 film under the tongue 2 times daily 06/15/21   Leticia Penna, MD  buprenorphine-naloxone (SUBOXONE) 2-0.5 mg SUBL SL tablet Place 1 tablet by mouth under the tongue now then 1 tablet in 1 to 2 hours then 1 tablet 3 to 4 hours later, then starting tomorrow (06/02/21) take 4 tablets twice  a day 06/01/21   Leticia Penna, MD  buprenorphine-naloxone (SUBOXONE) 8-2 mg SUBL SL tablet Place 1 tablet under the tongue 3 times day 08/20/21   Leticia Penna, MD  clonazePAM (KLONOPIN) 1 MG tablet Take 1 tablet (1 mg total) by mouth at bedtime for 7 days. 01/07/19 01/14/19  Axel Filler, MD  FLUoxetine [facility]) 40 MG capsule Take 1 capsule by mouth once daily 02/22/20   Axel Filler, MD  furosemide (LASIX) 20 MG tablet Take 1 tablet (20 mg total) by mouth daily. 10/08/18   Sid Falcon, MD  gabapentin (NEURONTIN) 600 MG tablet Take 1 tablet (600 mg total) by mouth 2 (two) times daily.  01/05/19   Axel Filler, MD  Glecaprevir-Pibrentasvir (MAVYRET) 100-40 MG TABS Take 3 tablets by mouth daily with breakfast. 07/30/18   Kuppelweiser, Cassie L, RPH-CPP  IRON PO Take 1 tablet by mouth as needed (low Iron).     [provider]  levETIRAcetam (KEPPRA) 750 MG tablet Take 1 tablet (750 mg total) by mouth 2 (two) times daily. 01/05/19   Bloomfield, Carley D, DO  metroNIDAZOLE (FLAGYL) 500 MG tablet Take 1 tablet (500 mg total) by mouth 2 (two) times daily. 08/17/18   Axel Filler, MD  SUBOXONE 8-2 MG FILM Place 1 Film under the tongue in the morning and at bedtime. 04/20/20   Axel Filler, MD  topiramate (TOPAMAX) 100 MG tablet Take 1 tablet (100 mg total) by mouth 2 (two) times daily. 01/05/19   Bloomfield, Nila Nephew D, DO      Allergies    Aspartame, Codeine, Fish allergy, Sulfamethoxazole-trimethoprim, and Latex    Review of Systems   Review of Systems  All other systems reviewed and are negative.  Physical Exam Updated Vital Signs BP (!) 143/88   Pulse 66   Temp 98.1 F (36.7 C) (Oral)   Resp (!) 23   Ht 5\' 9"  (1.753 m)   Wt 84.8 kg   LMP 02/23/2014   SpO2 100%   BMI 27.62 kg/m  Physical Exam Vitals and nursing note reviewed.  Constitutional:      General: She is not in acute distress.    Appearance: She is well-developed.  HENT:     Head: Normocephalic.   Eyes:     Conjunctiva/sclera: Conjunctivae normal.  Cardiovascular:     Rate and Rhythm: Normal rate and regular rhythm.  Pulmonary:     Effort: Pulmonary effort is normal. No respiratory distress.     Breath sounds: Normal breath sounds. No stridor.  Abdominal:     General: There is no distension.  Skin:    General: Skin is warm and dry.  Neurological:     Mental Status: She is alert.     Cranial Nerves: No cranial nerve deficit.     Comments: No obvious deficits she moves all extremities spontaneously, face is symmetric, but she is clearly awakening from recent  seizure activity    ED Results / Procedures / Treatments   Labs (all labs ordered are listed, but only abnormal results are displayed) Labs Reviewed  COMPREHENSIVE METABOLIC PANEL - Abnormal; Notable for the following components:      Result Value   Potassium 3.3 (*)    CO2 18 (*)    Glucose, Bld 118 (*)    Total Protein 10.1 (*)    All other components within normal limits  CBC WITH DIFFERENTIAL/PLATELET - Abnormal; Notable for the following components:   RBC 5.19 (*)    All other components within normal limits  ETHANOL  URINALYSIS, ROUTINE W REFLEX MICROSCOPIC  RAPID URINE DRUG SCREEN, HOSP PERFORMED  I-STAT BETA HCG BLOOD, ED ([facility], [facility], [facility] ONLY)    EKG None  Radiology CT Head Wo Contrast  Result Date: 11/22/2021 CLINICAL DATA:  Witnessed seizures, history of substance abuse EXAM: CT HEAD WITHOUT CONTRAST TECHNIQUE: Contiguous axial images were obtained from the base of the skull through the vertex without intravenous contrast. RADIATION DOSE REDUCTION: This exam was performed according to the departmental dose-optimization program which includes automated exposure control, adjustment of the mA and/or kV according to patient size and/or use of iterative reconstruction technique. COMPARISON:  09/04/2021 FINDINGS: Brain: No acute infarct or hemorrhage. Lateral ventricles and midline structures are unremarkable. No acute extra-axial fluid collections. No mass effect. Vascular: No hyperdense vessel or unexpected calcification. Skull: Normal. Negative for fracture or focal lesion. Sinuses/Orbits: No acute finding. Other: None. IMPRESSION: 1. Stable head CT, no acute intracranial process. Electronically Signed   By: Randa Ngo M.D.   On: 11/22/2021 18:08   CT Maxillofacial WO CM  Result Date: 11/22/2021 CLINICAL DATA:  Seizure, fell, facial trauma EXAM: CT MAXILLOFACIAL WITHOUT CONTRAST TECHNIQUE: Multidetector CT imaging of the maxillofacial structures was performed. Multiplanar CT  image reconstructions were also generated. RADIATION DOSE REDUCTION: This exam was performed according to the departmental dose-optimization program which includes automated exposure control, adjustment of the mA and/or kV according to patient size and/or use of iterative reconstruction technique. COMPARISON:  09/04/2021 FINDINGS: Osseous: No fracture or mandibular dislocation. No destructive process. Orbits: Negative. No traumatic or inflammatory finding. Sinuses: Clear. Soft tissues: Sebaceous cyst left cheek.  No soft tissue swelling. Limited intracranial: No significant or unexpected finding. IMPRESSION: 1. No acute facial bone fracture. Electronically Signed   By: Randa Ngo M.D.   On: 11/22/2021 18:11    Procedures Procedures    Medications Ordered in ED Medications  levETIRAcetam (KEPPRA) IVPB 500 mg/100 mL premix (0 mg Intravenous Stopped 11/22/21 1741)  LORazepam (ATIVAN) injection 2 mg (2 mg Intramuscular Given 11/22/21 1654)  ED Course/ Medical Decision Making/ A&P This patient with a Hx of seizure disorder, substance abuse presents to the ED for concern of seizure, this involves an extensive number of treatment options, and is a complaint that carries with it a high risk of complications and morbidity.    The differential diagnosis includes breakthrough seizure, medication noncompliance, new infection or other abnormality   Social Determinants of Health:  Incarceration  Additional history obtained:  Additional history and/or information obtained from [facility] deputies, notable for HPI   After the initial evaluation, orders, including: Labs, CT given the unclear circumstances, trauma, jaw pain were initiated.   Patient placed on Cardiac and Pulse-Oximetry Monitors. The patient was maintained on a cardiac monitor.  The cardiac monitored showed an rhythm of 70 sinus normal The patient was also maintained on pulse oximetry. The readings were typically 100% room air  normal   On repeat evaluation of the patient improved  Lab Tests:  I personally interpreted labs.  The pertinent results include: Trivial hypokalemia and hyperglycemia  Imaging Studies ordered:  I independently visualized and interpreted imaging which showed intracranial abnormality, no TMJ abnormalities I agree with the radiologist interpretation   Dispostion / Final MDM:  After consideration of the diagnostic results and the patient's response to treatment, patient be discharged to jail.  I discussed discharge plan with corrections facility officers.  Particularly we discussed the importance of provision for medication seizure. Here no evidence for post seizure trauma, no substantial electrolyte abnormalities no evidence for infection, bacteremia, sepsis, patient appropriate for discharge.  Final Clinical Impression(s) / ED Diagnoses Final diagnoses:  Seizure [facility])     Carmin Muskrat, MD 11/22/21 2040

## 2021-11-22 NOTE — ED Notes (Signed)
Patient transported to CT 

## 2021-11-22 NOTE — Discharge Instructions (Addendum)
As discussed, your evaluation today has been largely reassuring.  But, it is important that you monitor your condition carefully, and do not hesitate to return to the ED if you develop new, or concerning changes in your condition.  Please be sure to take all medication as prescribed, particularly your Keppra and Topamax.  Otherwise, please follow-up with your physician for appropriate ongoing care.

## 2021-11-22 NOTE — ED Triage Notes (Signed)
Patient BIB EMS. Patient had 2 witnessed seizures. One seizures lasting 12 minutes. Patient taking keppra. Hx of substance abuse currently on suboxone. Patient AxOx4 upon EMS arrival.   Vital signs were:  142/90 22-RR 100% on room air 163-CBG

## 2021-12-15 ENCOUNTER — Emergency Department (HOSPITAL_COMMUNITY)
Admission: EM | Admit: 2021-12-15 | Discharge: 2021-12-16 | Disposition: A | Discharge status: Home / Self Care | Attending: Emergency Medicine | Admitting: Emergency Medicine

## 2021-12-15 ENCOUNTER — Other Ambulatory Visit: Payer: Self-pay

## 2021-12-15 ENCOUNTER — Emergency Department (HOSPITAL_COMMUNITY)

## 2021-12-15 ENCOUNTER — Encounter (HOSPITAL_COMMUNITY): Payer: Self-pay

## 2021-12-15 DIAGNOSIS — S0990XA Unspecified injury of head, initial encounter: Secondary | ICD-10-CM | POA: Diagnosis present

## 2021-12-15 DIAGNOSIS — S060XAA Concussion with loss of consciousness status unknown, initial encounter: Secondary | ICD-10-CM | POA: Insufficient documentation

## 2021-12-15 DIAGNOSIS — W01198A Fall on same level from slipping, tripping and stumbling with subsequent striking against other object, initial encounter: Secondary | ICD-10-CM | POA: Insufficient documentation

## 2021-12-15 DIAGNOSIS — Y92149 Unspecified place in prison as the place of occurrence of the external cause: Secondary | ICD-10-CM | POA: Insufficient documentation

## 2022-04-19 ENCOUNTER — Emergency Department (HOSPITAL_COMMUNITY)
Admission: EM | Admit: 2022-04-19 | Discharge: 2022-04-20 | Attending: Emergency Medicine | Admitting: Emergency Medicine

## 2022-04-19 ENCOUNTER — Encounter (HOSPITAL_COMMUNITY): Payer: Self-pay

## 2022-04-19 ENCOUNTER — Emergency Department (HOSPITAL_COMMUNITY)

## 2022-04-19 ENCOUNTER — Other Ambulatory Visit: Payer: Self-pay

## 2022-04-19 DIAGNOSIS — Z91040 Latex allergy status: Secondary | ICD-10-CM | POA: Insufficient documentation

## 2022-04-19 DIAGNOSIS — W01198A Fall on same level from slipping, tripping and stumbling with subsequent striking against other object, initial encounter: Secondary | ICD-10-CM | POA: Insufficient documentation

## 2022-04-19 DIAGNOSIS — Z79899 Other long term (current) drug therapy: Secondary | ICD-10-CM | POA: Insufficient documentation

## 2022-04-19 DIAGNOSIS — R200 Anesthesia of skin: Secondary | ICD-10-CM | POA: Diagnosis present

## 2022-04-19 DIAGNOSIS — R519 Headache, unspecified: Secondary | ICD-10-CM | POA: Diagnosis not present

## 2022-04-19 DIAGNOSIS — E876 Hypokalemia: Secondary | ICD-10-CM | POA: Diagnosis not present

## 2022-04-19 LAB — URINALYSIS, ROUTINE W REFLEX MICROSCOPIC
Bilirubin Urine: NEGATIVE
Glucose, UA: NEGATIVE mg/dL
Hgb urine dipstick: NEGATIVE
Ketones, ur: NEGATIVE mg/dL
Leukocytes,Ua: NEGATIVE
Nitrite: NEGATIVE
Protein, ur: NEGATIVE mg/dL
Specific Gravity, Urine: 1.015 (ref 1.005–1.030)
pH: 6 (ref 5.0–8.0)

## 2022-04-19 LAB — COMPREHENSIVE METABOLIC PANEL
ALT: 10 U/L (ref 0–44)
AST: 12 U/L — ABNORMAL LOW (ref 15–41)
Albumin: 3.8 g/dL (ref 3.5–5.0)
Alkaline Phosphatase: 48 U/L (ref 38–126)
Anion gap: 6 (ref 5–15)
BUN: 17 mg/dL (ref 6–20)
CO2: 23 mmol/L (ref 22–32)
Calcium: 8.4 mg/dL — ABNORMAL LOW (ref 8.9–10.3)
Chloride: 108 mmol/L (ref 98–111)
Creatinine, Ser: 0.83 mg/dL (ref 0.44–1.00)
GFR, Estimated: >60 mL/min (ref >60)
Glucose, Bld: 82 mg/dL (ref 70–99)
Potassium: 3.2 mmol/L — ABNORMAL LOW (ref 3.5–5.1)
Sodium: 137 mmol/L (ref 135–145)
Total Bilirubin: 0.4 mg/dL (ref 0.3–1.2)
Total Protein: 7.6 g/dL (ref 6.5–8.1)

## 2022-04-19 LAB — CBC WITH DIFFERENTIAL/PLATELET
Abs Immature Granulocytes: 0.01 10*3/uL (ref 0.00–0.07)
Basophils Absolute: 0 10*3/uL (ref 0.0–0.1)
Basophils Relative: 1 %
Eosinophils Absolute: 0.1 10*3/uL (ref 0.0–0.5)
Eosinophils Relative: 1 %
HCT: 38.6 % (ref 36.0–46.0)
Hemoglobin: 12.9 g/dL (ref 12.0–15.0)
Immature Granulocytes: 0 %
Lymphocytes Relative: 44 %
Lymphs Abs: 2 10*3/uL (ref 0.7–4.0)
MCH: 32.4 pg (ref 26.0–34.0)
MCHC: 33.4 g/dL (ref 30.0–36.0)
MCV: 97 fL (ref 80.0–100.0)
Monocytes Absolute: 0.4 10*3/uL (ref 0.1–1.0)
Monocytes Relative: 8 %
Neutro Abs: 2.1 10*3/uL (ref 1.7–7.7)
Neutrophils Relative %: 46 %
Platelets: 230 10*3/uL (ref 150–400)
RBC: 3.98 MIL/uL (ref 3.87–5.11)
RDW: 12.1 % (ref 11.5–15.5)
WBC: 4.6 10*3/uL (ref 4.0–10.5)
nRBC: 0 % (ref 0.0–0.2)

## 2022-04-19 LAB — RAPID URINE DRUG SCREEN, HOSP PERFORMED
Amphetamines: NOT DETECTED
Barbiturates: NOT DETECTED
Benzodiazepines: NOT DETECTED
Cocaine: NOT DETECTED
Opiates: NOT DETECTED
Tetrahydrocannabinol: NOT DETECTED

## 2022-04-19 LAB — CBG MONITORING, ED: Glucose-Capillary: 78 mg/dL (ref 70–99)

## 2022-04-19 LAB — I-STAT BETA HCG BLOOD, ED (MC, WL, AP ONLY): I-stat hCG, quantitative: <5 m[IU]/mL (ref <5)

## 2022-04-19 LAB — MAGNESIUM: Magnesium: 2.2 mg/dL (ref 1.7–2.4)

## 2022-04-19 MED ORDER — LACTATED RINGERS IV BOLUS
1000.0000 mL | Freq: Once | INTRAVENOUS | Status: AC
  Administered 2022-04-19: 1000 mL via INTRAVENOUS

## 2022-04-19 MED ORDER — GADOBUTROL 1 MMOL/ML IV SOLN
8.0000 mL | Freq: Once | INTRAVENOUS | Status: AC | PRN
  Administered 2022-04-19: 8 mL via INTRAVENOUS

## 2022-04-19 MED ORDER — DIPHENHYDRAMINE HCL 50 MG/ML IJ SOLN
25.0000 mg | Freq: Once | INTRAMUSCULAR | Status: AC
  Administered 2022-04-19: 25 mg via INTRAVENOUS
  Filled 2022-04-19: qty 1

## 2022-04-19 MED ORDER — POTASSIUM CHLORIDE CRYS ER 20 MEQ PO TBCR
40.0000 meq | EXTENDED_RELEASE_TABLET | Freq: Once | ORAL | Status: AC
  Administered 2022-04-19: 40 meq via ORAL
  Filled 2022-04-19: qty 2

## 2022-04-19 MED ORDER — METOCLOPRAMIDE HCL 5 MG/ML IJ SOLN
10.0000 mg | Freq: Once | INTRAMUSCULAR | Status: AC
  Administered 2022-04-19: 10 mg via INTRAVENOUS
  Filled 2022-04-19: qty 2

## 2022-04-19 NOTE — ED Provider Notes (Signed)
[facility]-EMERGENCY DEPT Provider Note   CSN: [id number] Arrival date & time: 04/19/22  1856     History  Chief Complaint  Patient presents with   Numbness    Elizabeth Clarke is a 36 y.o. female.  HPI 36 year old female presents with numbness.  Originally started 5 days ago.  When it first started she had bilateral facial numbness.  She then noticed some numbness in her arms and then in her legs.  There is some numbness/tingling in the right toes and right fingertips but primarily the numbness is bilateral face as well as her entire left arm and left leg.  She also has been having a headache ever since she suffered a concussion in June and was seen here.  She has been having daily headaches.  However she has been having some issues with balance since the numbness started and fell yesterday, striking her head.  Her headache is now worse and rates a 10 out of 10.  She also describes some photophobia as well as seeing spots in her vision, primarily right-sided.  She has been taking ibuprofen without relief.  She is also on Keppra and Topamax for seizure disorder.  She thinks her last seizure was about 3 days ago.  She denies any other significant past medical history.  Home Medications Prior to Admission medications   Medication Sig Start Date End Date Taking? Authorizing Provider  Buprenorphine HCl-Naloxone HCl 2-0.5 MG FILM See patient instructions 05/25/21   Kern Alberta, MD  Buprenorphine HCl-Naloxone HCl 8-2 MG FILM Dissolve 1 film under the tongue 2 times daily 06/15/21   Kern Alberta, MD  buprenorphine-naloxone (SUBOXONE) 2-0.5 mg SUBL SL tablet Place 1 tablet by mouth under the tongue now then 1 tablet in 1 to 2 hours then 1 tablet 3 to 4 hours later, then starting tomorrow (06/02/21) take 4 tablets twice  a day 06/01/21   Kern Alberta, MD  buprenorphine-naloxone (SUBOXONE) 8-2 mg SUBL SL tablet Place 1 tablet under the tongue 3 times day 08/20/21    Kern Alberta, MD  clonazePAM (KLONOPIN) 1 MG tablet Take 1 tablet (1 mg total) by mouth at bedtime for 7 days. 01/07/19 01/14/19  Tyson Alias, MD  FLUoxetine [facility]) 40 MG capsule Take 1 capsule by mouth once daily 02/22/20   Tyson Alias, MD  furosemide (LASIX) 20 MG tablet Take 1 tablet (20 mg total) by mouth daily. 10/08/18   Inez Catalina, MD  gabapentin (NEURONTIN) 600 MG tablet Take 1 tablet (600 mg total) by mouth 2 (two) times daily. 01/05/19   Tyson Alias, MD  Glecaprevir-Pibrentasvir (MAVYRET) 100-40 MG TABS Take 3 tablets by mouth daily with breakfast. 07/30/18   Kuppelweiser, Cassie L, RPH-CPP  IRON PO Take 1 tablet by mouth as needed (low Iron).     [provider]  levETIRAcetam (KEPPRA) 750 MG tablet Take 1 tablet (750 mg total) by mouth 2 (two) times daily. 01/05/19   Bloomfield, Carley D, DO  metroNIDAZOLE (FLAGYL) 500 MG tablet Take 1 tablet (500 mg total) by mouth 2 (two) times daily. 08/17/18   Tyson Alias, MD  SUBOXONE 8-2 MG FILM Place 1 Film under the tongue in the morning and at bedtime. 04/20/20   Tyson Alias, MD  topiramate (TOPAMAX) 100 MG tablet Take 1 tablet (100 mg total) by mouth 2 (two) times daily. 01/05/19   Bloomfield, Karma Ganja D, DO      Allergies    Aspartame, Codeine,  Fish allergy, Latex, and Sulfamethoxazole-trimethoprim    Review of Systems   Review of Systems  Constitutional:  Negative for fever.  Gastrointestinal:  Negative for diarrhea and vomiting.  Musculoskeletal:  Positive for gait problem. Negative for neck pain.  Neurological:  Positive for dizziness, numbness and headaches.    Physical Exam Updated Vital Signs BP 102/65   Pulse (!) 55   Temp 98.1 F (36.7 C) (Oral)   Resp 18   Ht 5\' 9"  (1.753 m)   Wt 81.2 kg   LMP 02/23/2014   SpO2 98%   BMI 26.43 kg/m  Physical Exam Vitals and nursing note reviewed.  Constitutional:      Appearance: She is well-developed.  HENT:      Head: Normocephalic and atraumatic.  Eyes:     Extraocular Movements: Extraocular movements intact.     Pupils: Pupils are equal, round, and reactive to light.  Cardiovascular:     Rate and Rhythm: Normal rate and regular rhythm.     Heart sounds: Normal heart sounds.  Pulmonary:     Effort: Pulmonary effort is normal.     Breath sounds: Normal breath sounds.  Abdominal:     General: There is no distension.  Musculoskeletal:     Cervical back: Normal range of motion and neck supple. No rigidity. No spinous process tenderness.  Skin:    General: Skin is warm and dry.  Neurological:     Mental Status: She is alert.     Comments: Patient reports decreased to no sensation in her face as well as entire left arm and leg. She feels less than normal but more than left side sensation in right arm and leg. She has good strength in RUE, but some weakness or poor effort in LUE and BLE. No facial droop or slurred speech. When she smiles it is symmetric but minimal and with poor effort     ED Results / Procedures / Treatments   Labs (all labs ordered are listed, but only abnormal results are displayed) Labs Reviewed  COMPREHENSIVE METABOLIC PANEL - Abnormal; Notable for the following components:      Result Value   Potassium 3.2 (*)    Calcium 8.4 (*)    AST 12 (*)    All other components within normal limits  MAGNESIUM  URINALYSIS, ROUTINE W REFLEX MICROSCOPIC  RAPID URINE DRUG SCREEN, HOSP PERFORMED  CBC WITH DIFFERENTIAL/PLATELET  CBG MONITORING, ED  I-STAT BETA HCG BLOOD, ED ([facility], [facility], [facility] ONLY)    EKG EKG Interpretation  Date/Time:  Friday April 19 2022 20:20:38 EDT Ventricular Rate:  69 PR Interval:  168 QRS Duration: 83 QT Interval:  428 QTC Calculation: 459 R Axis:   60 Text Interpretation: Sinus rhythm Low voltage, precordial leads no acute ST/T changes No old tracing to compare Confirmed by Sherwood Gambler [phone]) on 04/19/2022 9:59:49 PM  Radiology CT Head Wo  Contrast  Result Date: 04/19/2022 CLINICAL DATA:  Headache, dizziness, bilateral upper and lower extremity numbness for 5 days EXAM: CT HEAD WITHOUT CONTRAST TECHNIQUE: Contiguous axial images were obtained from the base of the skull through the vertex without intravenous contrast. RADIATION DOSE REDUCTION: This exam was performed according to the departmental dose-optimization program which includes automated exposure control, adjustment of the mA and/or kV according to patient size and/or use of iterative reconstruction technique. COMPARISON:  12/15/2021 FINDINGS: Brain: No acute infarct or hemorrhage. The lateral ventricles and midline structures are unremarkable. No acute extra-axial fluid collections. No mass effect.  Vascular: No hyperdense vessel or unexpected calcification. Skull: Normal. Negative for fracture or focal lesion. Sinuses/Orbits: No acute finding. Other: None. IMPRESSION: 1. No acute intracranial process. Electronically Signed   By: Sharlet Salina M.D.   On: 04/19/2022 20:37    Procedures Procedures    Medications Ordered in ED Medications  potassium chloride SA (KLOR-CON M) CR tablet 40 mEq (has no administration in time range)  lactated ringers bolus 1,000 mL (1,000 mLs Intravenous New Bag/Given 04/19/22 2017)  metoCLOPramide (REGLAN) injection 10 mg (10 mg Intravenous Given 04/19/22 2014)  diphenhydrAMINE (BENADRYL) injection 25 mg (25 mg Intravenous Given 04/19/22 2012)    ED Course/ Medical Decision Making/ A&P                           Medical Decision Making Amount and/or Complexity of Data Reviewed Labs: ordered.    Details: Mild hypokalemia at 3.2, unlikely to cause her significant paresthesias. Radiology: ordered and independent interpretation performed.    Details: CT head without head bleed ECG/medicine tests: ordered and independent interpretation performed.    Details: No acute ST/T changes.  Risk Prescription drug management.   Patient has some mild  left-sided weakness although it is hard to tell if this is functional versus true weakness.  With her diffuse paresthesias, worse on the left side as well, I think she will ultimately need MRI.  CT head and lab work is overall unremarkable besides some mild hypokalemia.  MRI currently pending and care was transferred to Dr. Read Drivers with these images and disposition pending.  Otherwise, she is not altered, showing signs of acute CNS infection, etc.        Final Clinical Impression(s) / ED Diagnoses Final diagnoses:  None    Rx / DC Orders ED Discharge Orders     None         Pricilla Loveless, MD 04/19/22 2326

## 2022-04-19 NOTE — ED Triage Notes (Signed)
Pt BIB [facility] c/o bilateral arm and leg numbness x 5 days.

## 2022-04-20 NOTE — ED Provider Notes (Signed)
Nursing notes and vitals signs, including pulse oximetry, reviewed.  Summary of this visit's results, reviewed by myself:  EKG:  EKG Interpretation  Date/Time:  Friday April 19 2022 20:20:38 EDT Ventricular Rate:  69 PR Interval:  168 QRS Duration: 83 QT Interval:  428 QTC Calculation: 459 R Axis:   60 Text Interpretation: Sinus rhythm Low voltage, precordial leads no acute ST/T changes No old tracing to compare Confirmed by Pricilla Loveless [phone]) on 04/19/2022 9:59:49 PM        Labs:  Results for orders placed or performed during the hospital encounter of 04/19/22 (from the past 24 hour(s))  Urinalysis, Routine w reflex microscopic Urine, Clean Catch     Status: None   Collection Time: 04/19/22  7:38 PM  Result Value Ref Range   Color, Urine YELLOW YELLOW   APPearance CLEAR CLEAR   Specific Gravity, Urine 1.015 1.005 - 1.030   pH 6.0 5.0 - 8.0   Glucose, UA NEGATIVE NEGATIVE mg/dL   Hgb urine dipstick NEGATIVE NEGATIVE   Bilirubin Urine NEGATIVE NEGATIVE   Ketones, ur NEGATIVE NEGATIVE mg/dL   Protein, ur NEGATIVE NEGATIVE mg/dL   Nitrite NEGATIVE NEGATIVE   Leukocytes,Ua NEGATIVE NEGATIVE  Urine rapid drug screen (hosp performed)     Status: None   Collection Time: 04/19/22  7:38 PM  Result Value Ref Range   Opiates NONE DETECTED NONE DETECTED   Cocaine NONE DETECTED NONE DETECTED   Benzodiazepines NONE DETECTED NONE DETECTED   Amphetamines NONE DETECTED NONE DETECTED   Tetrahydrocannabinol NONE DETECTED NONE DETECTED   Barbiturates NONE DETECTED NONE DETECTED  CBG monitoring, ED     Status: None   Collection Time: 04/19/22  8:17 PM  Result Value Ref Range   Glucose-Capillary 78 70 - 99 mg/dL  Comprehensive metabolic panel     Status: Abnormal   Collection Time: 04/19/22  8:22 PM  Result Value Ref Range   Sodium 137 135 - 145 mmol/L   Potassium 3.2 (L) 3.5 - 5.1 mmol/L   Chloride 108 98 - 111 mmol/L   CO2 23 22 - 32 mmol/L   Glucose, Bld 82 70 - 99 mg/dL    BUN 17 6 - 20 mg/dL   Creatinine, Ser 8.56 0.44 - 1.00 mg/dL   Calcium 8.4 (L) 8.9 - 10.3 mg/dL   Total Protein 7.6 6.5 - 8.1 g/dL   Albumin 3.8 3.5 - 5.0 g/dL   AST 12 (L) 15 - 41 U/L   ALT 10 0 - 44 U/L   Alkaline Phosphatase 48 38 - 126 U/L   Total Bilirubin 0.4 0.3 - 1.2 mg/dL   GFR, Estimated >31 >49 mL/min   Anion gap 6 5 - 15  Magnesium     Status: None   Collection Time: 04/19/22  8:22 PM  Result Value Ref Range   Magnesium 2.2 1.7 - 2.4 mg/dL  CBC with Differential     Status: None   Collection Time: 04/19/22  8:22 PM  Result Value Ref Range   WBC 4.6 4.0 - 10.5 K/uL   RBC 3.98 3.87 - 5.11 MIL/uL   Hemoglobin 12.9 12.0 - 15.0 g/dL   HCT 70.2 63.7 - 85.8 %   MCV 97.0 80.0 - 100.0 fL   MCH 32.4 26.0 - 34.0 pg   MCHC 33.4 30.0 - 36.0 g/dL   RDW 85.0 27.7 - 41.2 %   Platelets 230 150 - 400 K/uL   nRBC 0.0 0.0 - 0.2 %   Neutrophils Relative %  46 %   Neutro Abs 2.1 1.7 - 7.7 K/uL   Lymphocytes Relative 44 %   Lymphs Abs 2.0 0.7 - 4.0 K/uL   Monocytes Relative 8 %   Monocytes Absolute 0.4 0.1 - 1.0 K/uL   Eosinophils Relative 1 %   Eosinophils Absolute 0.1 0.0 - 0.5 K/uL   Basophils Relative 1 %   Basophils Absolute 0.0 0.0 - 0.1 K/uL   Immature Granulocytes 0 %   Abs Immature Granulocytes 0.01 0.00 - 0.07 K/uL  I-Stat beta hCG blood, ED     Status: None   Collection Time: 04/19/22  8:55 PM  Result Value Ref Range   I-stat hCG, quantitative <5.0 <5 mIU/mL   Comment 3            Imaging Studies: MR Brain W and Wo Contrast  Result Date: 04/20/2022 CLINICAL DATA:  Neuro deficit, stroke suspected, headache EXAM: MRI HEAD WITHOUT AND WITH CONTRAST TECHNIQUE: Multiplanar, multiecho pulse sequences of the brain and surrounding structures were obtained without and with intravenous contrast. CONTRAST:  10mL GADAVIST GADOBUTROL 1 MMOL/ML IV SOLN COMPARISON:  02/11/2021 MRI head FINDINGS: Brain: No restricted diffusion to suggest acute or subacute infarct. No abnormal  parenchymal or meningeal enhancement. No acute hemorrhage, mass, mass effect, or midline shift. No hemosiderin deposition to suggest remote hemorrhage. No hydrocephalus or extra-axial collection. Normal pituitary and craniocervical junction. Vascular: Normal arterial flow voids. Skull and upper cervical spine: Normal marrow signal. Sinuses/Orbits: Normal orbits. Clear paranasal sinuses. Other: The mastoids are well-aerated. IMPRESSION: Normal MRI of the brain. No acute intracranial process. No abnormal enhancement. Electronically Signed   By: Merilyn Baba M.D.   On: 04/20/2022 00:56   MR Cervical Spine W or Wo Contrast  Result Date: 04/20/2022 CLINICAL DATA:  Cervical myelopathy EXAM: MRI CERVICAL SPINE WITHOUT AND WITH CONTRAST TECHNIQUE: Multiplanar and multiecho pulse sequences of the cervical spine, to include the craniocervical junction and cervicothoracic junction, were obtained without and with intravenous contrast. CONTRAST:  34mL GADAVIST GADOBUTROL 1 MMOL/ML IV SOLN COMPARISON:  No prior MRI of the cervical spine, correlation is made with CT cervical spine 11/04/2021 FINDINGS: Alignment: Physiologic. Vertebrae: No fracture, evidence of discitis, or bone lesion. No abnormal enhancement. Cord: Normal signal and morphology.  No abnormal enhancement. Posterior Fossa, vertebral arteries, paraspinal tissues: Negative. Disc levels: C2-C3: No significant disc bulge. No spinal canal stenosis or neuroforaminal narrowing. C3-C4: No significant disc bulge. No spinal canal stenosis or neuroforaminal narrowing. C4-C5: Significant disc bulge. Left-greater-than-right uncovertebral hypertrophy. No spinal canal stenosis. Mild left neural foraminal narrowing. C5-C6: Minimal disc bulge. No spinal canal stenosis or neural foraminal narrowing. C6-C7: Minimal disc bulge. No spinal canal stenosis or neural foraminal narrowing. C7-T1: No significant disc bulge. No spinal canal stenosis or neuroforaminal narrowing. IMPRESSION:  1. C4-C5 mild left neural foraminal narrowing. 2. No spinal canal stenosis. 3. No abnormal cord signal or enhancement. Electronically Signed   By: Merilyn Baba M.D.   On: 04/20/2022 00:51   CT Head Wo Contrast  Result Date: 04/19/2022 CLINICAL DATA:  Headache, dizziness, bilateral upper and lower extremity numbness for 5 days EXAM: CT HEAD WITHOUT CONTRAST TECHNIQUE: Contiguous axial images were obtained from the base of the skull through the vertex without intravenous contrast. RADIATION DOSE REDUCTION: This exam was performed according to the departmental dose-optimization program which includes automated exposure control, adjustment of the mA and/or kV according to patient size and/or use of iterative reconstruction technique. COMPARISON:  12/15/2021 FINDINGS: Brain: No acute infarct or  hemorrhage. The lateral ventricles and midline structures are unremarkable. No acute extra-axial fluid collections. No mass effect. Vascular: No hyperdense vessel or unexpected calcification. Skull: Normal. Negative for fracture or focal lesion. Sinuses/Orbits: No acute finding. Other: None. IMPRESSION: 1. No acute intracranial process. Electronically Signed   By: Sharlet Salina M.D.   On: 04/19/2022 20:37      Latecia Miler, Jonny Ruiz, MD 04/20/22 1610

## 2022-08-22 ENCOUNTER — Ambulatory Visit: Admitting: Neurology

## 2023-06-20 IMAGING — CT CT MAXILLOFACIAL W/O CM
3 series · 16 of 47 positions shown, 19 images · non-contrast
Comparison: 09/04/2021

CLINICAL DATA: Seizure, fell, facial trauma



[Series 3: max soft · axial · 0.34mm/px · z∈[-259,-133]mm · 10 of 75 slices shown, 13 images]
[im 6/75  brain]
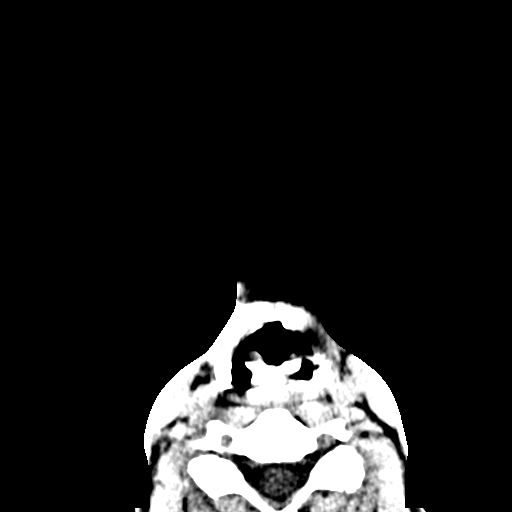
[im 6/75  bone]
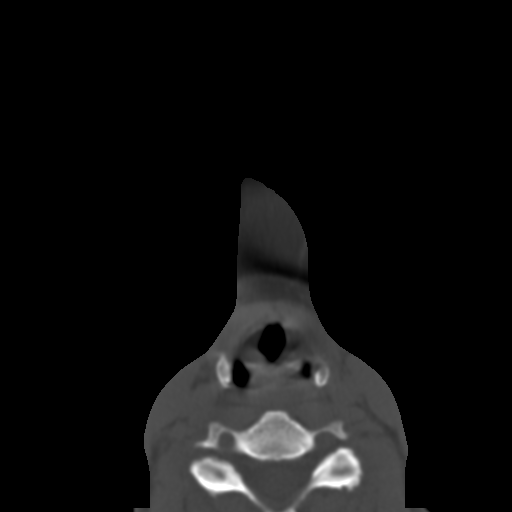
[im 13/75  bone]
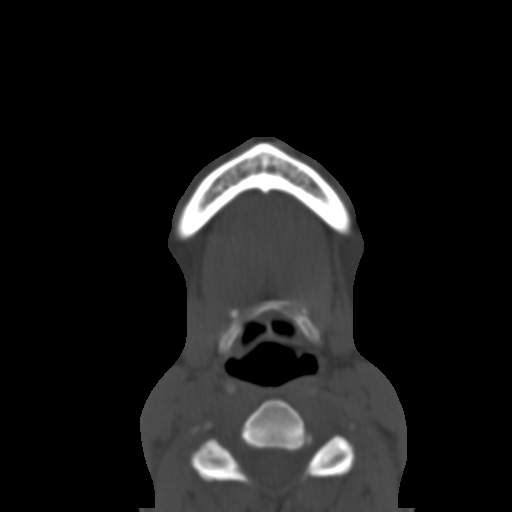
[im 21/75  bone]
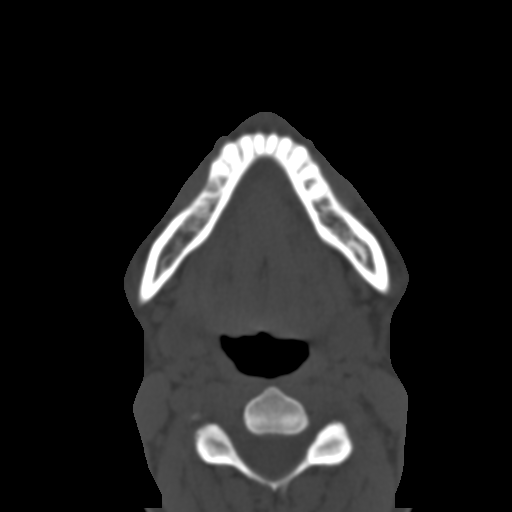
[im 26/75  bone]
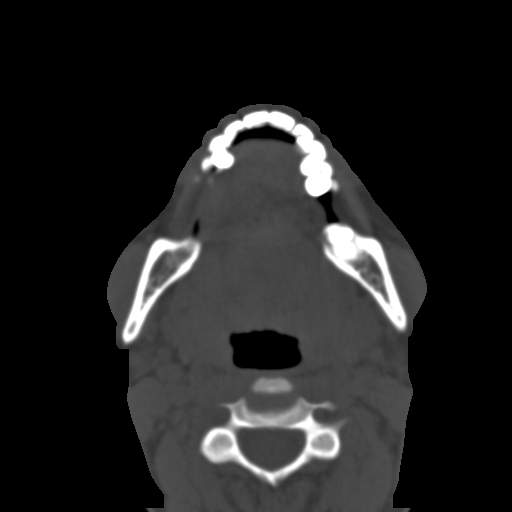
[im 34/75  brain]
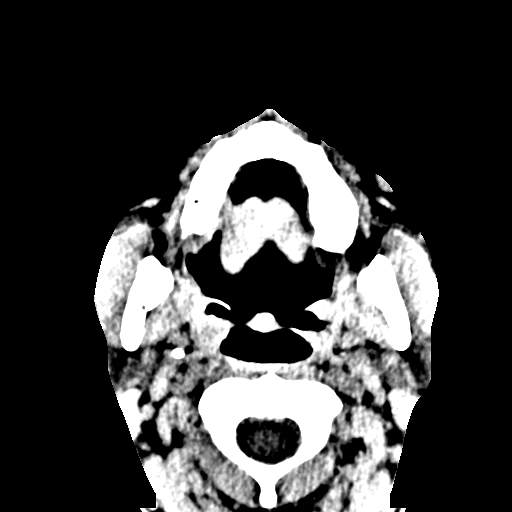
[im 34/75  bone]
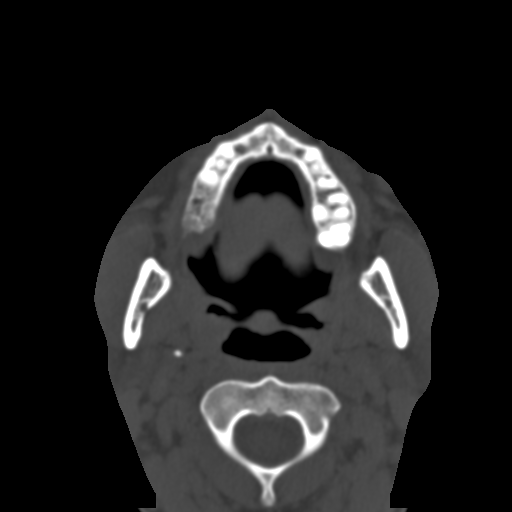
[im 41/75  bone]
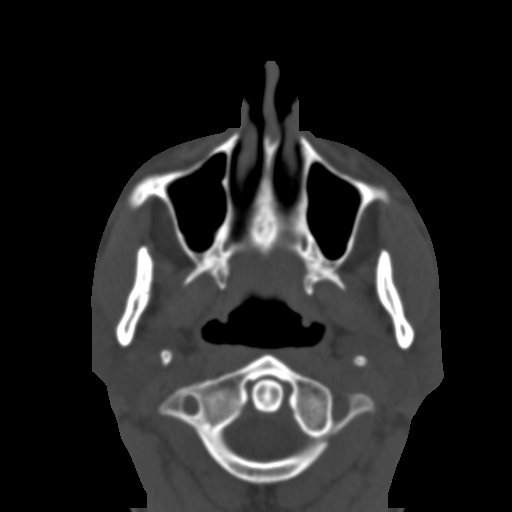
[im 49/75  bone]
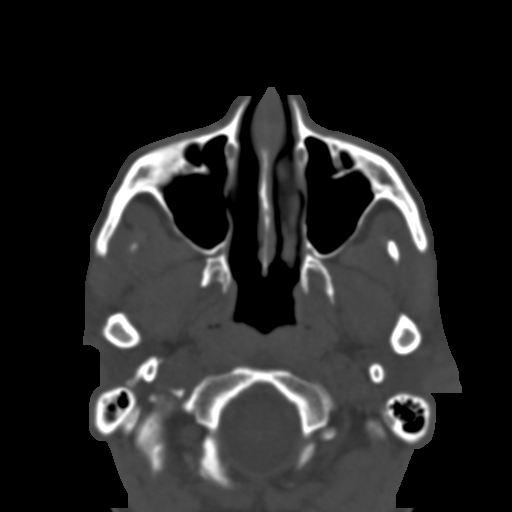
[im 57/75  bone]
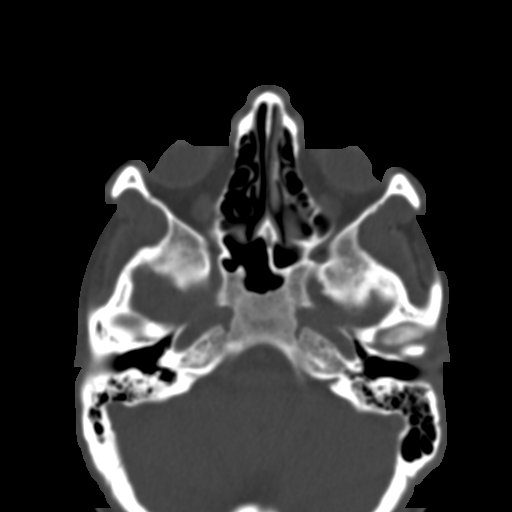
[im 62/75  brain]
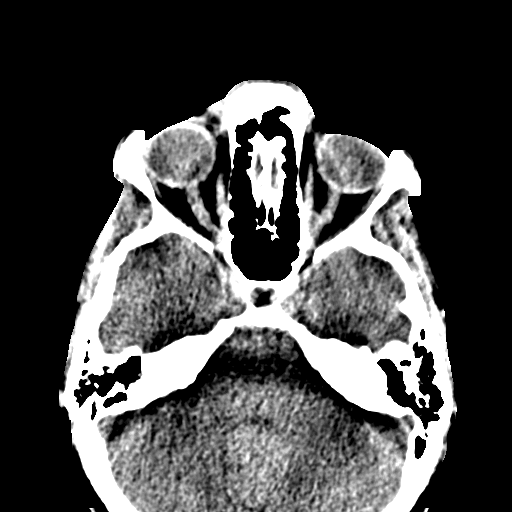
[im 62/75  bone]
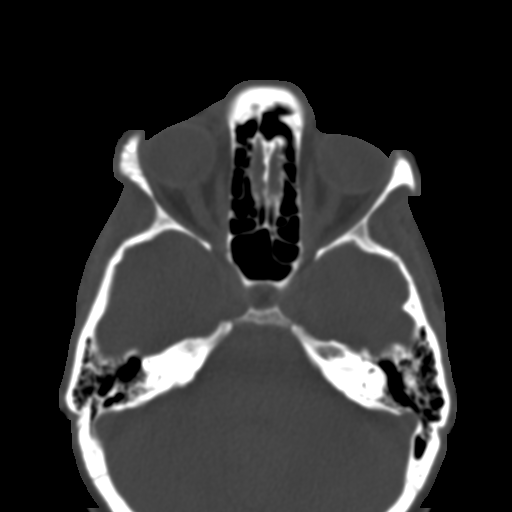
[im 69/75  bone]
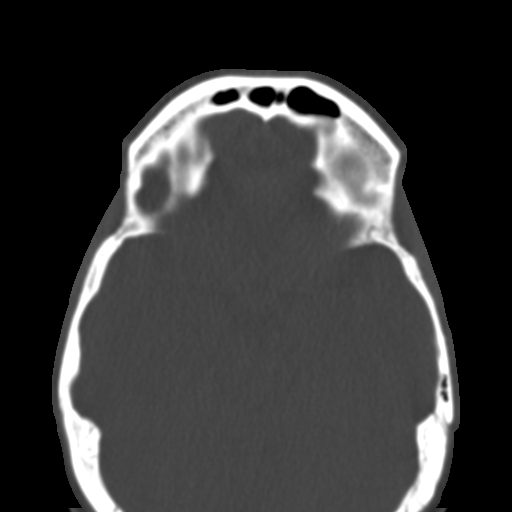

[Series 7: coronal soft · coronal · 0.31mm/px · 3 of 76 slices shown]
[im 26/76  bone]
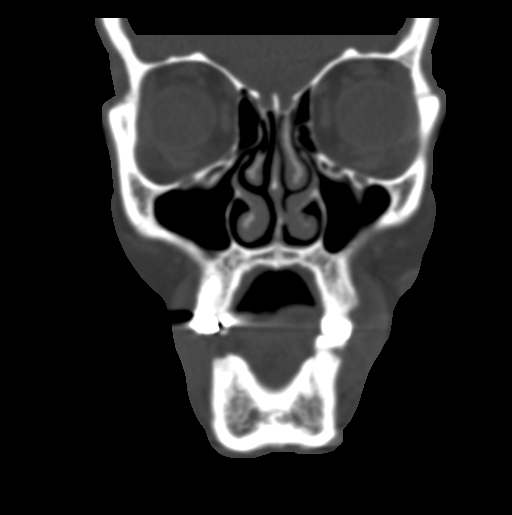
[im 34/76  bone]
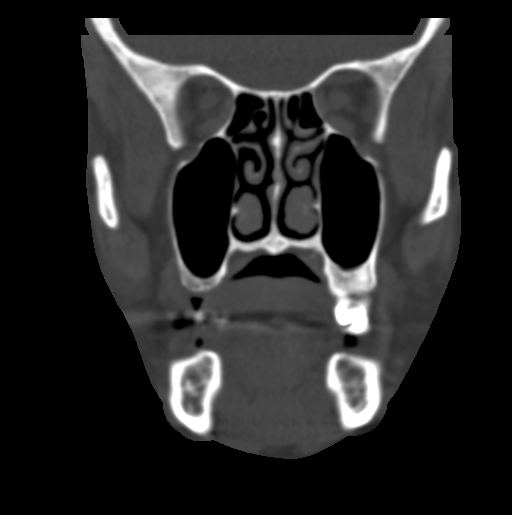
[im 42/76  bone]
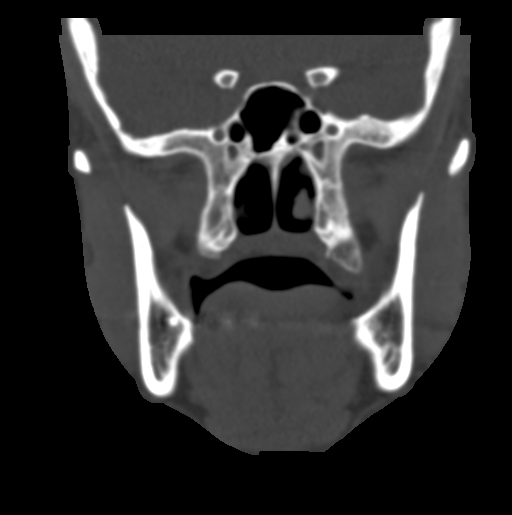

[Series 8: sagittal soft · sagittal · 0.29mm/px · 3 of 79 slices shown]
[im 27/79  bone]
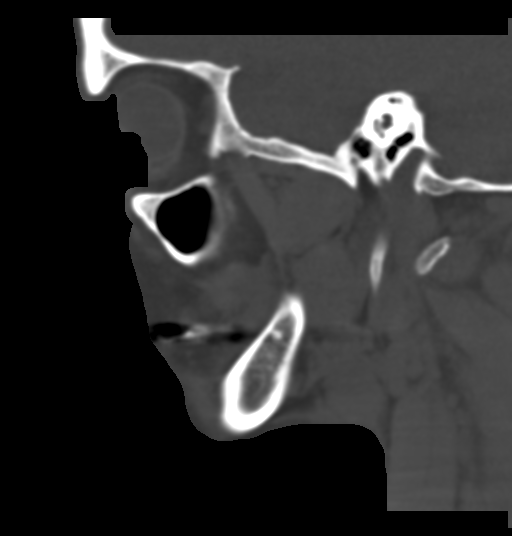
[im 40/79  bone]
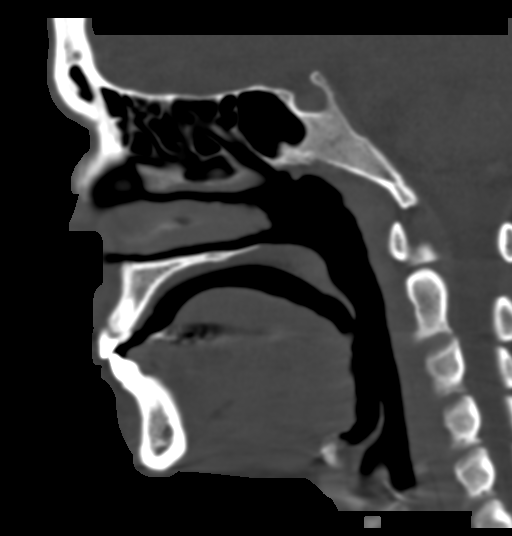
[im 53/79  bone]
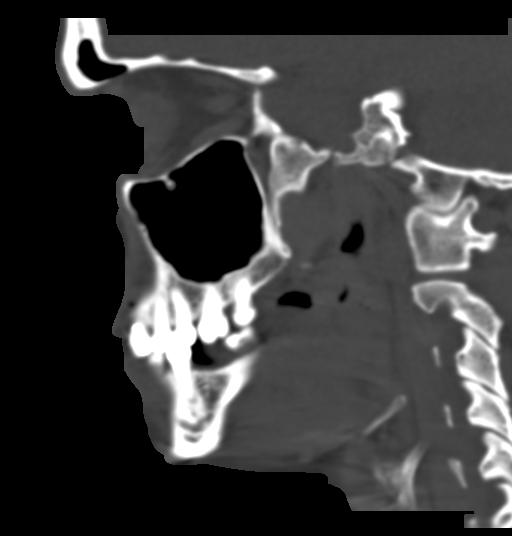

[16 of 47 positions shown; findings below may reference images not displayed]

FINDINGS: Osseous: No fracture or mandibular dislocation. No destructive
process.

Orbits: Negative. No traumatic or inflammatory finding.

Sinuses: Clear.

Soft tissues: Sebaceous cyst left cheek.  No soft tissue swelling.

Limited intracranial: No significant or unexpected finding.
IMPRESSION: 1. No acute facial bone fracture.

## 2023-06-20 IMAGING — CT CT HEAD W/O CM
3 series · 16 of 47 positions shown, 19 images · non-contrast
Comparison: 09/04/2021

CLINICAL DATA: Witnessed seizures, history of substance abuse



[Series 3: head wo · axial · 0.40mm/px · z∈[-151,-26]mm · 10 of 31 slices shown, 13 images]
[im 3/31  brain]
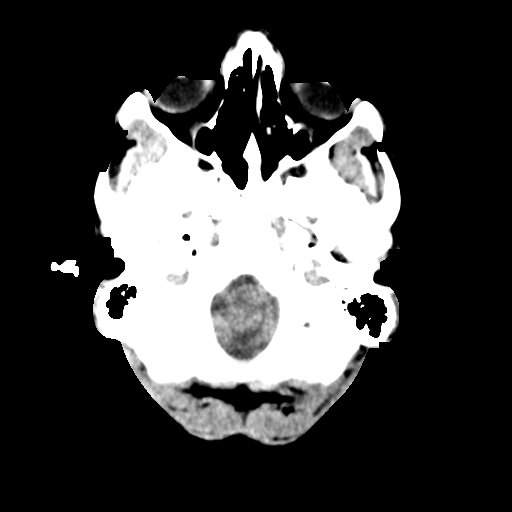
[im 3/31  bone]
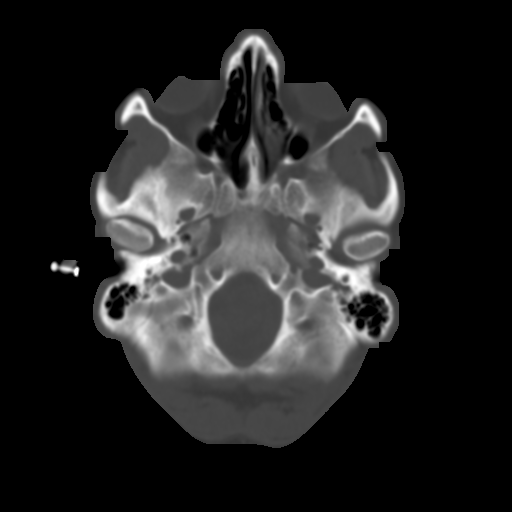
[im 6/31  brain]
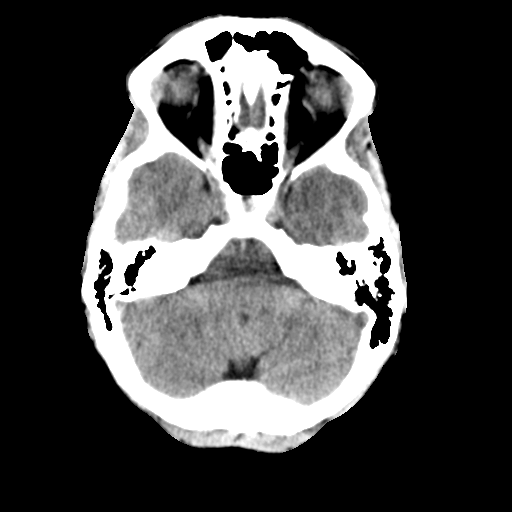
[im 9/31  brain]
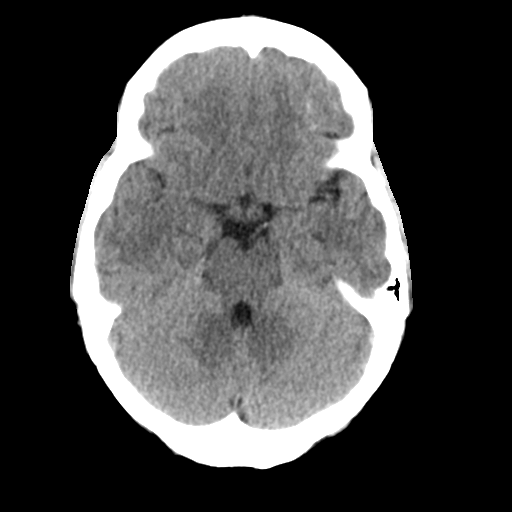
[im 11/31  brain]
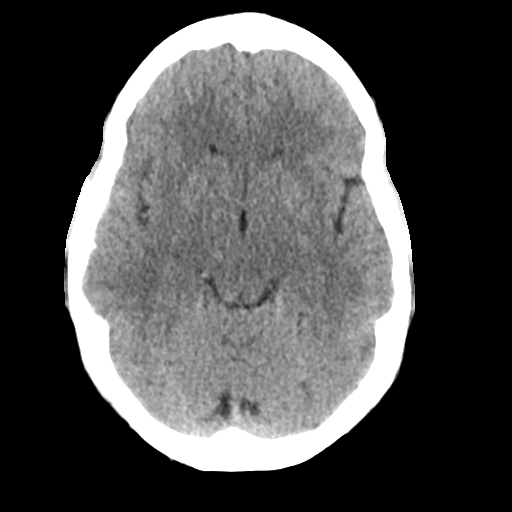
[im 14/31  brain]
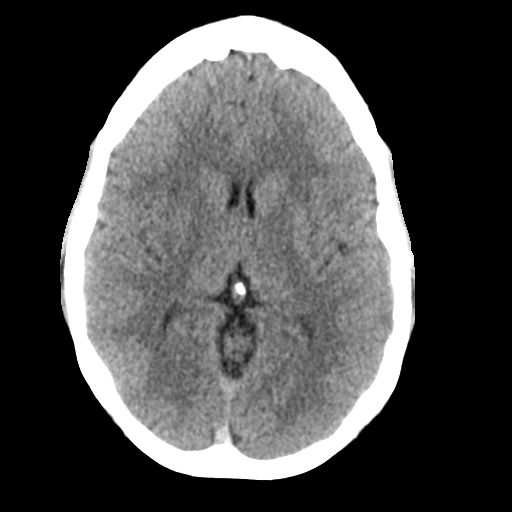
[im 14/31  bone]
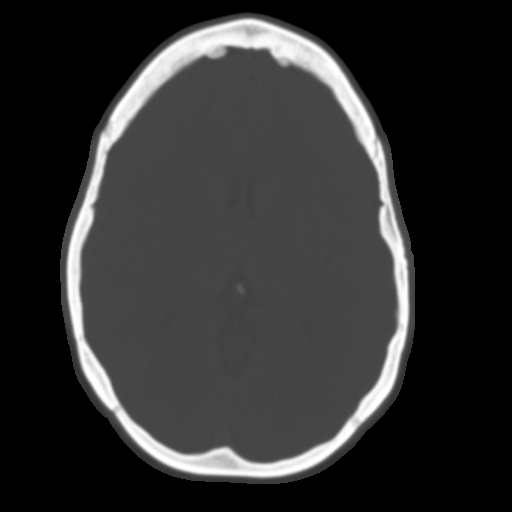
[im 17/31  brain]
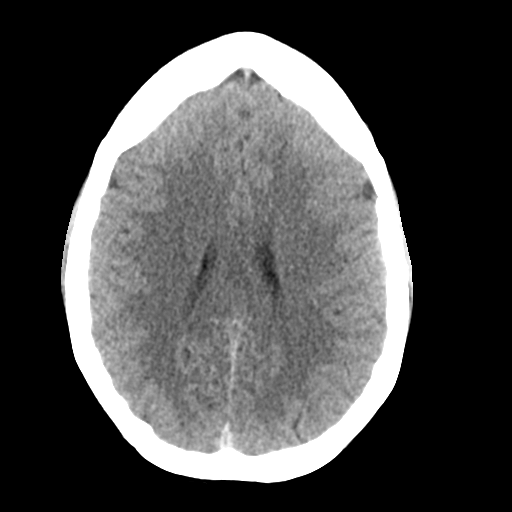
[im 20/31  brain]
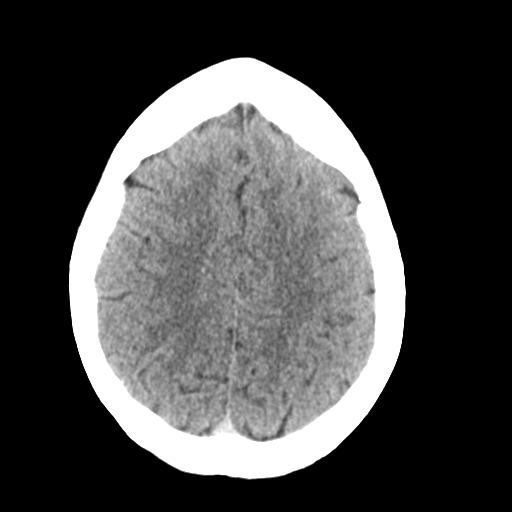
[im 23/31  brain]
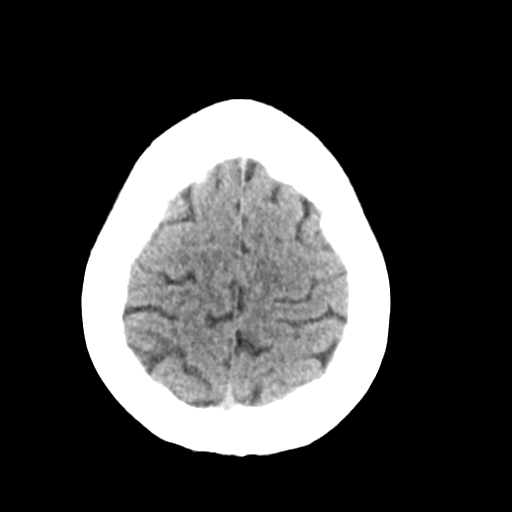
[im 25/31  brain]
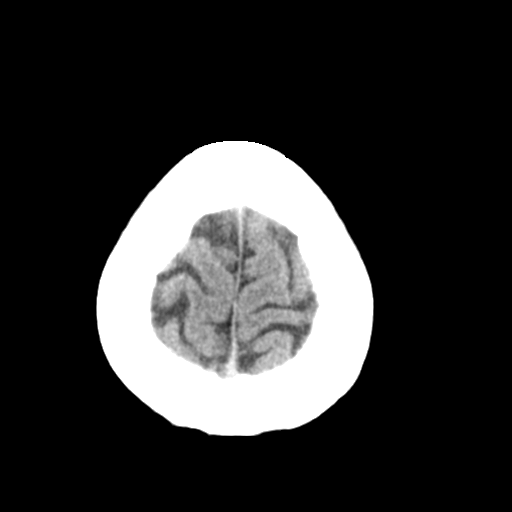
[im 25/31  bone]
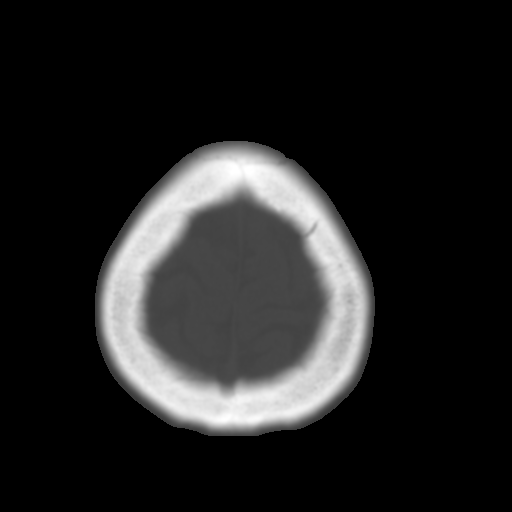
[im 28/31  brain]
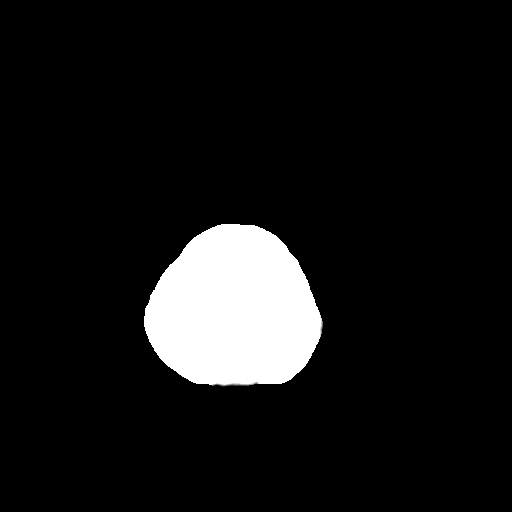

[Series 6: coronal soft tissue · coronal · 0.29mm/px · 3 of 67 slices shown]
[im 23/67  brain]
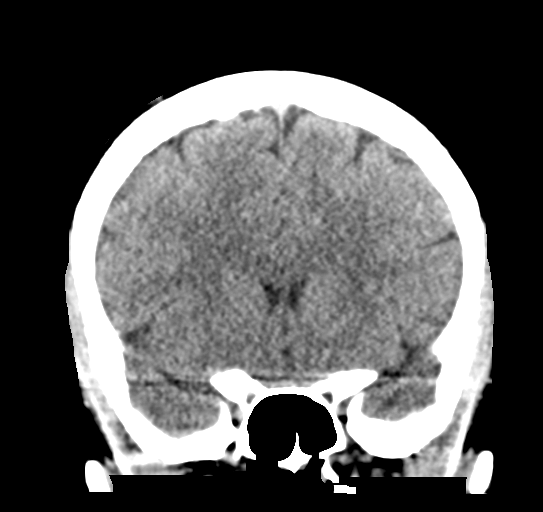
[im 30/67  brain]
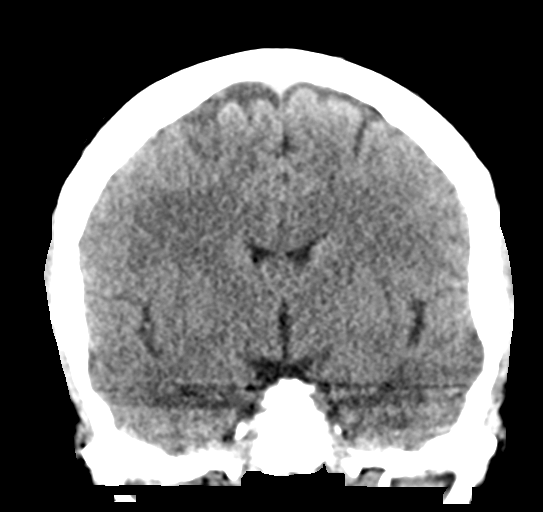
[im 37/67  brain]
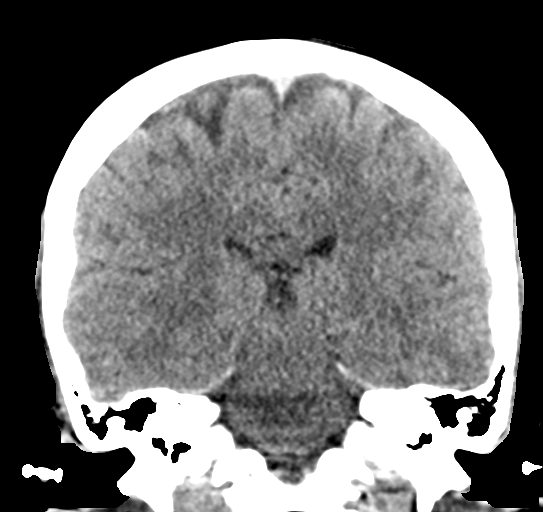

[Series 7: sagittal soft tissue · sagittal · 0.30mm/px · 3 of 54 slices shown]
[im 18/54  brain]
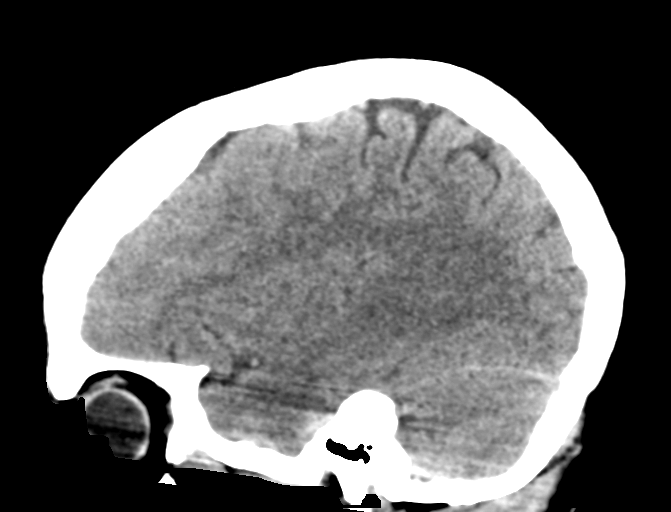
[im 27/54  brain]
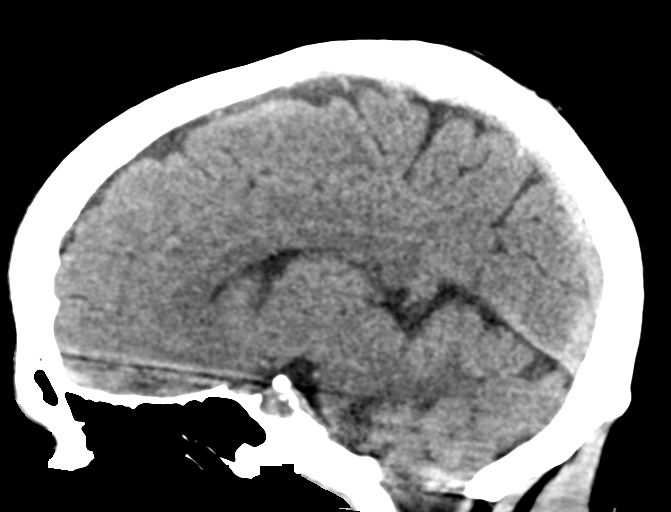
[im 36/54  brain]
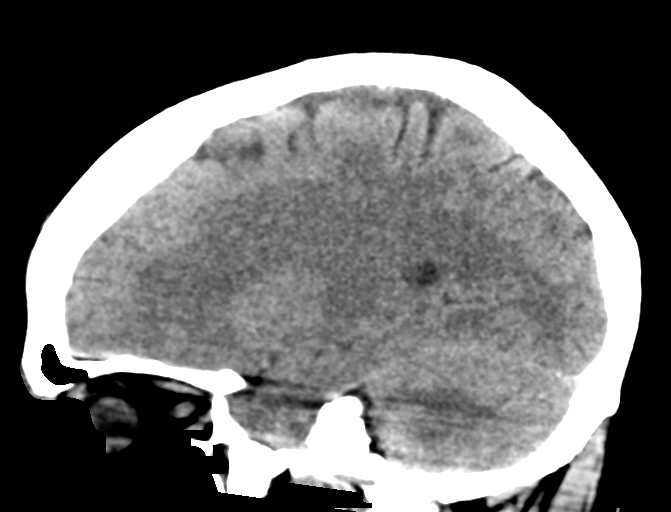

[16 of 47 positions shown; findings below may reference images not displayed]

FINDINGS: Brain: No acute infarct or hemorrhage. Lateral ventricles and
midline structures are unremarkable. No acute extra-axial fluid
collections. No mass effect.

Vascular: No hyperdense vessel or unexpected calcification.

Skull: Normal. Negative for fracture or focal lesion.

Sinuses/Orbits: No acute finding.

Other: None.
IMPRESSION: 1. Stable head CT, no acute intracranial process.

## 2024-03-22 ENCOUNTER — Encounter (HOSPITAL_COMMUNITY): Payer: Self-pay | Admitting: Psychiatry

## 2024-03-22 ENCOUNTER — Ambulatory Visit (INDEPENDENT_AMBULATORY_CARE_PROVIDER_SITE_OTHER): Admitting: Psychiatry

## 2024-03-22 DIAGNOSIS — F3181 Bipolar II disorder: Secondary | ICD-10-CM

## 2024-03-22 DIAGNOSIS — F411 Generalized anxiety disorder: Secondary | ICD-10-CM

## 2024-03-22 DIAGNOSIS — F4310 Post-traumatic stress disorder, unspecified: Secondary | ICD-10-CM

## 2024-03-22 MED ORDER — PRAZOSIN HCL 1 MG PO CAPS: 1.0000 mg | ORAL_CAPSULE | Freq: Every day | ORAL | 3 refills | Status: AC

## 2024-03-22 MED ORDER — QUETIAPINE FUMARATE 50 MG PO TABS: 50.0000 mg | ORAL_TABLET | Freq: Every day | ORAL | 3 refills | Status: AC
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
## 2024-05-25 ENCOUNTER — Encounter (HOSPITAL_COMMUNITY): Payer: Self-pay

## 2024-05-27 ENCOUNTER — Ambulatory Visit (HOSPITAL_COMMUNITY): Admitting: Clinical

## 2024-05-29 NOTE — Care Plan (Signed)
  Problem: PAIN - ADULT Goal: Verbalizes/displays adequate comfort level or baseline comfort level Description: INTERVENTIONS: 1. Encourage pt to monitor pain and request assistance 2. Assess pain using appropriate pain scale 3. Administer analgesics based on type and severity of pain and evaluate response 4. Implement non-pharmacological measures as appropriate and evaluate response 5. Consider cultural and social influences on pain and pain management 6. Notify LIP if interventions unsuccessful or patient reports new pain Outcome: Progressing   Problem: Safety - Adult Goal: Free from fall injury Description: INTERVENTIONS: 1. Assess pt frequently for physical needs 2. Identify cognitive and physical deficits and behaviors that affect risk of falls. 3. Institute fall precautions as indicated by assessment. 4. Educate pt/family on patient safety including physical limitations 5. Instruct pt to call for assistance with activity based on assessment 6. Modify environment to reduce risk of injury 7. Consider OT/PT consult to assist with strengthening/mobility Outcome: Progressing   Problem: Chronic Conditions and Co-Morbidities Goal: Patient's chronic conditions and co-morbidity symptoms are monitored and maintained or improved Description: INTERVENTIONS: 1. Monitor and assess patient's chronic conditions and comorbid symptoms for stability, deterioration, or improvement 2. Collaborate with multidisciplinary team to address chronic and comorbid conditions and prevent exacerbation or deterioration 3. Update acute care plan with appropriate goals if chronic or comorbid symptoms are exacerbated and prevent overall improvement and discharge Outcome: Progressing   Problem: Risk for Fall-Universal Goal: Fall Prevention During Hospitalization-Universal Outcome: Progressing   Problem: Pain Goal: Improvement in pain assessment Outcome: Progressing   Problem: Health Behavior Goal: Ability  to state ways to decrease the risk of falls will improve Description: Ability to state ways to decrease the risk of falls will improve Outcome: Progressing Goal: MCB Ability to verbalize activity precautions or restrictions will improve Description: Ability to verbalize activity precautions or restrictions will improve Outcome: Progressing Goal: Knowledge of the prescribed therapeutic regimen will improve Description: Knowledge of the prescribed therapeutic regimen will improve Outcome: Progressing   Problem: Medication: Goal: Compliance with prescribed medication regimen will improve - STG Description: Compliance with prescribed medication regimen will improve Outcome: Progressing   Problem: Neuropsychiatric: Goal: Compliance with therapy and medications for personality disorder will improve Description: Compliance with therapy and medications for personality disorder will improve Outcome: Progressing

## 2024-05-30 NOTE — Progress Notes (Signed)
 Case Management Screening  CSN: [id number] DOB: [birth year] Service: General Medicine Location: 680/01   Initial Screening Readmission Risk Score v2: 23.3 Risk Level: High - Patient meets high risk criteria for post hospital services. Assessment to be completed. > 20% Readmission Risk Score: Yes Patient has new or exacerbation of functional deficits:: Yes History of multiple unscheduled readmission or hospitalization, including re-admission with 30 days:: Yes Discharge needs identified:: Yes Discharge needs:: Undetermined, CM/SW Acute substance abuse/ETOH (r/t admission diagnosis):: Yes  PMH of bipolar type II, polysubstance abuse (heroin, fentanyl, benzodiazepine, methamphetamine) chronic hepatitis C, migraine without aura, TIA   Gastroenteritis #Dehydration  #Hypokalemia #Sinus tachycardia #Polysubstance abuse 10-year Hx of IV heroin use. PLAN: - GIPP - C. difficile antigen - medication management - Labs - UDS was positive for fentanyl. - Suboxone  2 films daily today followed by 3.5 films tomorrow - HIV rule out - Hepatitis screen to eval for Hep A and B immunity (if non-immune, will need vaccination) - Hepatitis C viral PCR  PCP: HUTCHINSON, ANDREA MOZELLE  Insurance: [place] MED[place] CR[place] MED[place] BLUE      Sabrina CHRISTELLA Platts, RN

## 2024-05-30 NOTE — Progress Notes (Deleted)
 BH MD Outpatient Progress Note  05/30/2024 6:25 PM Elizabeth Clarke  MRN:  [id number]  Assessment:  Elizabeth Clarke presents for follow-up evaluation. Today, 05/30/24, patient reports ***  The risks/benefits/side-effects/alternatives to this medication were discussed in detail with the patient and time was given for questions. The patient consents to medication trial.   Identifying Information: Elizabeth Clarke is a 38 y.o. female with a history of *** who is an established patient with [facility] for management of ***.  Risk Assessment: An assessment of suicide and violence risk factors was performed as part of this evaluation and is not *** significantly changed from the last visit.             While future psychiatric events cannot be accurately predicted, the patient does not *** currently require acute inpatient psychiatric care and does not *** currently meet   involuntary commitment criteria.          Plan:  # *** Past medication trials:  Status of problem: *** Interventions: -- ***  # *** Past medication trials:  Status of problem: *** Interventions: -- ***  # *** Past medication trials:  Status of problem: *** Interventions: -- ***  Return to care in ***  Patient was given contact information for behavioral health clinic and was instructed to call 911 for emergencies.   Patient and plan of care will be discussed with the Attending MD ,Dr. ***, who agrees with the above statement and plan.   Subjective:  Chief Complaint: No chief complaint on file.  Interval History: *** --had drug overdose with 30mg  heroin IV, administered narcan  IN, denied intent or self-harm. Seen at ED and discharged.  --admitted to hospital for N/V/D  Initial note Interval notes / No shows: --seen at ED for bronchitis, rx prednisone --admitted to hospital 10/6 - 10/9 - started prednisone but stopped since in holding cell. She injected heroin IV  2 days ago. Reported taking suboxone . She was admitted for cellulitis in [place] after spider bite and started on IV clindamycin as well as AKI and acute viral bronchitis. She was then switched to doxycylcine.  -seen again 11/17 for drug OD on IV heroin -Admitted 12/6 for gastroenteritis, dehydration, hypokalemia  Labs: CMP, CBC, UDS PDMP EKG MRI brain / EEG: Sleep study: Prior meds Med adherence  Patient reports mood is *** Patient reports getting **** hours of sleep  Patient reports *** appetite Patient reports stressors include *** Patient reports ***adherence with medications. Patient reports *** side effects. Patient reports *** substance use Patient ***denies SI/HI/AVH.    Visit Diagnosis: No diagnosis found.  Past Psychiatric History:  Diagnoses: Opioid use, depression, anxiety, PTSD, bipolar disorder  Medication trials: klonopin , Paxil (disliked made irritable), Prozac  (disliked made irritable), Abilify, Seroquel , Suboxone , hydroxyzine , and gabapentin , topamax  Previous psychiatrist/therapist: Niki Bach  Hospitalizations: *** Suicide attempts: *** SIB: *** Hx of violence towards others: *** Current access to guns: *** Hx of trauma/abuse: *** Developmental history: ***  Initial note:   Substance Use History:  EtOH:   Nicotine:   THC/CBD:  IV drug use:  Stimulants:  Opiates:  Sedative/hypnotics:  Hallucinogens:  Seizures:   Past Medical History:  Past Medical History:  Diagnosis Date   Chronic viral hepatitis C (HCC) 07/10/2018   Diabetes mellitus without complication (HCC)    Seizures (HCC)    No past surgical history on file.   LMP: Contraception:  Family Psychiatric History:  Maternal aunts bipolar, PTSD, schizophrenia, Paternal aunt bipolar  disorder, Paternal uncle bipolar, schizophrenia, and PTSD, brother suicide (hanging),   Family History: No family history on file.  Social History:  Patient is homeless and resides in her car in  [place]. She is unemployed. She has a 49 year old daughter who lives with her father.  Academic/Vocational: *** Housing:  Income: Family: Support:  Children:  Marital Status  Substance Use History:   Social History   Socioeconomic History   Marital status: Single    Spouse name: Not on file   Number of children: Not on file   Years of education: Not on file   Highest education level: Not on file  Occupational History   Not on file  Tobacco Use   Smoking status: Never   Smokeless tobacco: Never  Substance and Sexual Activity   Alcohol use: No   Drug use: Yes    Types: Heroin, Other-see comments    Comment: injects roxys   Sexual activity: Yes  Other Topics Concern   Not on file  Social History Narrative   Not on file   Social Drivers of Health   Financial Resource Strain: High Risk (05/29/2024)   Received from [facility]   Overall Financial Resource Strain (CARDIA)    How hard is it for you to pay for the very basics like food, housing, medical care, and heating?: Very hard  Food Insecurity: Medium Risk (05/29/2024)   Received from [facility]   Hunger Vital Sign    Within the past 12 months, you worried that your food would run out before you got money to buy more: Sometimes true    Within the past 12 months, the food you bought just didn't last and you didn't have money to get more. : Sometimes true  Transportation Needs: Unmet Transportation Needs (05/29/2024)   Received from [facility]    In the past 12 months, has lack of reliable transportation kept you from medical appointments, meetings, work or from getting things needed for daily living? : Yes  Physical Activity: Not on file  Stress: Stress Concern Present (01/03/2023)   Received from [facility] - Occupational Stress Questionnaire    Feeling of Stress : Very much  Social Connections: Moderately Isolated (05/29/2024)   Received from  [facility]   Social Connection and Isolation Panel    In a typical week, how many times do you talk on the phone with family, friends, or neighbors?: Once a week    How often do you get together with friends or relatives?: Never    How often do you attend church or religious services?: More than 4 times per year    Do you belong to any clubs or organizations such as church groups, unions, fraternal or athletic groups, or school groups?: Yes    How often do you attend meetings of the clubs or organizations you belong to?: More than 4 times per year    Are you married, widowed, divorced, separated, never married, or living with a partner?: Never married    Allergies:  Allergies  Allergen Reactions   Aspartame Anaphylaxis and Hives   Codeine Hives and Swelling    Throat swelling. Throat swells up Throat swells up  Throat swelling. Throat swelling. Throat swells up Throat swells up   Fish Allergy Anaphylaxis   Latex Rash and Hives   Sulfamethoxazole-Trimethoprim Rash    Current Medications: Current Outpatient Medications  Medication Sig Dispense Refill  Buprenorphine  HCl-Naloxone  HCl 2-0.5 MG FILM See patient instructions 17 each 0   Buprenorphine  HCl-Naloxone  HCl 8-2 MG FILM Dissolve 1 film under the tongue 2 times daily 9 each 0   buprenorphine -naloxone  (SUBOXONE ) 2-0.5 mg SUBL SL tablet Place 1 tablet by mouth under the tongue now then 1 tablet in 1 to 2 hours then 1 tablet 3 to 4 hours later, then starting tomorrow (06/02/21) take 4 tablets twice  a day 51 tablet 0   buprenorphine -naloxone  (SUBOXONE ) 8-2 mg SUBL SL tablet Place 1 tablet under the tongue 3 times day 15 tablet 0   clonazePAM  (KLONOPIN ) 1 MG tablet Take 1 tablet (1 mg total) by mouth at bedtime for 7 days. 30 tablet 0   FLUoxetine  (PROZAC ) 40 MG capsule Take 1 capsule by mouth once daily 30 capsule 5   furosemide  (LASIX ) 20 MG tablet Take 1 tablet (20 mg total) by mouth daily. 30 tablet 0   gabapentin   (NEURONTIN ) 600 MG tablet Take 1 tablet (600 mg total) by mouth 2 (two) times daily. 60 tablet 5   Glecaprevir -Pibrentasvir  (MAVYRET ) 100-40 MG TABS Take 3 tablets by mouth daily with breakfast. 84 tablet 1   IRON PO Take 1 tablet by mouth as needed (low Iron).      levETIRAcetam  (KEPPRA ) 750 MG tablet Take 1 tablet (750 mg total) by mouth 2 (two) times daily. 60 tablet 1   prazosin  (MINIPRESS ) 1 MG capsule Take 1 capsule (1 mg total) by mouth at bedtime. 30 capsule 3   QUEtiapine  (SEROQUEL ) 50 MG tablet Take 1 tablet (50 mg total) by mouth at bedtime. 30 tablet 3   SUBOXONE  8-2 MG FILM Place 1 Film under the tongue in the morning and at bedtime. 14 each 0   topiramate  (TOPAMAX ) 100 MG tablet Take 1 tablet (100 mg total) by mouth 2 (two) times daily. 60 tablet 1   No current facility-administered medications for this visit.    ROS: Review of Systems  Objective:  Psychiatric Specialty Exam: Last menstrual period 02/23/2014, unknown if currently breastfeeding.There is no height or weight on file to calculate BMI.  General Appearance: {Appearance:22683}  Eye Contact:  {BHH EYE CONTACT:22684}  Speech:  {Speech:22685}  Volume:  {Volume (PAA):22686}  Mood:  {BHH MOOD:22306}  Affect:  {Affect (PAA):22687}  Thought Content: {Thought Content:22690}   Suicidal Thoughts:  {ST/HT (PAA):22692}  Homicidal Thoughts:  {ST/HT (PAA):22692}  Thought Process:  {Thought Process (PAA):22688}  Orientation:  {BHH ORIENTATION (PAA):22689}    Memory: Grossly intact ***  Judgment:  {Judgement (PAA):22694}  Insight:  {Insight (PAA):22695}  Concentration:  {Concentration:21399}  Recall: not formally assessed ***  Fund of Knowledge: {BHH GOOD/FAIR/POOR:22877}  Language: {BHH GOOD/FAIR/POOR:22877}  Psychomotor Activity:  {Psychomotor (PAA):22696}  Akathisia:  {BHH YES OR NO:22294}  AIMS (if indicated): {Desc; done/not:10129}  Assets:  {Assets (PAA):22698}  ADL's:  {BHH JIO'D:77709}  Cognition: {chl bhh  cognition:304700322}  Sleep:  {BHH GOOD/FAIR/POOR:22877}   PE: General: well-appearing; no acute distress *** Pulm: no increased work of breathing on room air *** Strength & Muscle Tone: {desc; muscle tone:32375} Neuro: no focal neurological deficits observed *** Gait & Station: {PE GAIT ED NATL:22525}  Metabolic Disorder Labs: No results found for: HGBA1C, MPG No results found for: PROLACTIN No results found for: CHOL, TRIG, HDL, CHOLHDL, VLDL, LDLCALC Lab Results  Component Value Date   TSH 2.700 08/18/2018    Therapeutic Level Labs: No results found for: LITHIUM No results found for: VALPROATE No results found for: CBMZ  Screenings:  GAD-7    Flowsheet Row Office Visit from 03/22/2024 in [facility] Office Visit from 07/28/2018 in [facility] - A Dept Of [place]. [facility]  Total GAD-7 Score 17 20   PHQ2-9    Flowsheet Row Office Visit from 03/22/2024 in [facility] Office Visit from 02/15/2020 in [facility] - A Dept Of [place]. [facility] Office Visit from 01/05/2019 in [facility] - A Dept Of [place]. [facility] Office Visit from 08/18/2018 in [facility] - A Dept Of [place]. [facility] Office Visit from 08/11/2018 in [facility] - A Dept Of [place]. [facility]  PHQ-2 Total Score 5 6 6 6 5   PHQ-9 Total Score 17 20 22 25 23    Flowsheet Row Office Visit from 03/22/2024 in [facility] ED from 12/15/2021 in [facility] Emergency Department at [facility]  C-SSRS RISK CATEGORY Error: Q7 should not be populated when Q6 is No No Risk    Collaboration of Care: Collaboration of Care: [facility] OP Collaboration of Care:[id number]}  Patient/Guardian was advised Release of Information must be obtained prior to any record release in order  to collaborate their care with an outside provider. Patient/Guardian was advised if they have not already done so to contact the registration department to sign all necessary forms in order for us  to release information regarding their care.   Consent: Patient/Guardian gives verbal consent for treatment and assignment of benefits for services provided during this visit. Patient/Guardian expressed understanding and agreed to proceed.   Corean Minor, MD, PGY-3 05/30/2024, 6:25 PM
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
## 2024-05-31 NOTE — Care Plan (Signed)
  Problem: Risk for Fall-Universal Goal: Fall Prevention During Hospitalization-Universal Outcome: Progressing   Problem: Pain Goal: Improvement in pain assessment Outcome: Progressing   Problem: Bowel/Gastric: Goal: Establishment of normal bowel function will improve by discharge Description: Establishment of normal bowel function will improve by discharge Outcome: Not Progressing

## 2024-06-01 ENCOUNTER — Encounter (HOSPITAL_COMMUNITY): Admitting: Psychiatry
